# Patient Record
Sex: Female | Born: 1965
Health system: Southern US, Community
[De-identification: ages and names within clinical notes are randomized; demographics above are authoritative.]

## PROBLEM LIST (undated history)

## (undated) DIAGNOSIS — I10 Essential (primary) hypertension: Secondary | ICD-10-CM

## (undated) DIAGNOSIS — R7989 Other specified abnormal findings of blood chemistry: Secondary | ICD-10-CM

## (undated) HISTORY — DX: Essential (primary) hypertension: I10

## (undated) HISTORY — DX: Other specified abnormal findings of blood chemistry: R79.89

## (undated) HISTORY — PX: DILATION AND CURETTAGE OF UTERUS: SHX78

## (undated) HISTORY — PX: OTHER SURGICAL HISTORY: SHX169

---

## 2014-09-28 DIAGNOSIS — R7989 Other specified abnormal findings of blood chemistry: Secondary | ICD-10-CM

## 2014-09-28 HISTORY — DX: Other specified abnormal findings of blood chemistry: R79.89

## 2015-06-07 ENCOUNTER — Ambulatory Visit (INDEPENDENT_AMBULATORY_CARE_PROVIDER_SITE_OTHER): Payer: 59 | Admitting: Obstetrics and Gynecology

## 2015-06-07 ENCOUNTER — Other Ambulatory Visit: Payer: Self-pay | Admitting: Obstetrics and Gynecology

## 2015-06-07 ENCOUNTER — Encounter: Payer: Self-pay | Admitting: Obstetrics and Gynecology

## 2015-06-07 VITALS — BP 120/82 | HR 80 | Resp 16 | Ht 63.0 in | Wt 142.6 lb

## 2015-06-07 DIAGNOSIS — Z01419 Encounter for gynecological examination (general) (routine) without abnormal findings: Secondary | ICD-10-CM

## 2015-06-07 DIAGNOSIS — Z1231 Encounter for screening mammogram for malignant neoplasm of breast: Secondary | ICD-10-CM | POA: Diagnosis not present

## 2015-06-07 DIAGNOSIS — N926 Irregular menstruation, unspecified: Secondary | ICD-10-CM

## 2015-06-07 LAB — TSH: TSH: 6.726 u[IU]/mL — AB (ref 0.350–4.500)

## 2015-06-07 NOTE — Progress Notes (Signed)
Patient scheduled while in office for screening mammogram on 06/13/2015 at 12:40 pm with the [facility].

## 2015-06-07 NOTE — Patient Instructions (Signed)

## 2015-06-07 NOTE — Progress Notes (Signed)
Patient ID: Laurie King, female   DOB: Sep 07, 1966, 49 y.o.   MRN: [id number] GYNECOLOGY  VISIT   HPI: 49 y.o.   Married  Asian  female   G1P1 with Patient's last menstrual period was 05/11/2015 (exact date).   Wants an annual exam.  Insurance company recommending a routine annual exam for the patient and for her to establish care.  Husband and interpretor in room for entire history and examination.   Menses can be every 30 days or further apart.  Menses can last up to 10 days or can be short.  Generally, flow is light.  Some mid cycle bleeding on occasion. Some normal low back pain with cycles.  No hot flashes.   Uncertain if she had a dx of fibroid 51 years ago.   From [place].  Married for 3 years.  Met husband online and moved to the [place].  PCP - Dr. Christella Noa [facility]  GYNECOLOGIC HISTORY: Patient's last menstrual period was 05/11/2015 (exact date). Contraception:Condoms Menopausal hormone therapy: none Last mammogram: NEVER  Last pap smear: Unsure if she has ever had a pap smear Colonoscopy - none.   OB History    Gravida Para Term Preterm AB TAB SAB Ectopic Multiple Living   '1 1        1         '$ There are no active problems to display for this patient.   History reviewed. No pertinent past medical history.  Past Surgical History  Procedure Laterality Date  . Left arm surgery      No current outpatient prescriptions on file.   No current facility-administered medications for this visit.     ALLERGIES: Review of patient's allergies indicates no known allergies.  Family History  Problem Relation Age of Onset  . Aneurysm Father     Social History   Social History  . Marital Status: Married    Spouse Name: N/A  . Number of Children: N/A  . Years of Education: N/A   Occupational History  . Not on file.   Social History Main Topics  . Smoking status: Never Smoker   . Smokeless tobacco: Not on file  . Alcohol Use: No  . Drug Use:  No  . Sexual Activity:    Partners: Male    Birth Control/ Protection: Condom   Other Topics Concern  . Not on file   Social History Narrative  . No narrative on file    ROS:  Pertinent items are noted in HPI.  PHYSICAL EXAMINATION:    BP 120/82 mmHg  Pulse 80  Resp 16  Ht $R'5\' 3"'Wy$  (1.6 m)  Wt 142 lb 9.6 oz (64.683 kg)  BMI 25.27 kg/m2  LMP 05/11/2015 (Exact Date)    General appearance: alert, cooperative and appears stated age Head: Normocephalic, without obvious abnormality, atraumatic Neck: no adenopathy, supple, symmetrical, trachea midline and thyroid normal to inspection and palpation Lungs: clear to auscultation bilaterally Breasts: normal appearance, no masses or tenderness, Inspection negative, No nipple retraction or dimpling, No nipple discharge or bleeding, No axillary or supraclavicular adenopathy Heart: regular rate and rhythm Abdomen: soft, non-tender; bowel sounds normal; no masses,  no organomegaly Extremities: extremities normal, atraumatic, no cyanosis or edema Skin: Skin color, texture, turgor normal. No rashes or lesions Lymph nodes: Cervical, supraclavicular, and axillary nodes normal. No abnormal inguinal nodes palpated Neurologic: Grossly normal  Pelvic: External genitalia:  no lesions  Urethra:  normal appearing urethra with no masses, tenderness or lesions              Bartholins and Skenes: normal                 Vagina: normal appearing vagina with normal color and discharge, no lesions              Cervix: no lesions and Mild stenosis - bled with endocervical brush.              Pap taken: Yes.   Bimanual Exam:  Uterus:  normal size, contour, position, consistency, mobility, non-tender              Adnexa: normal adnexa and no mass, fullness, tenderness              Rectovaginal: Yes.  .  Confirms.              Anus:  normal sphincter tone, no lesions  Chaperone was present for exam.  ASSESSMENT  Perimenopausal female.  Some  menstrual alterations.  Normal gynecologic exam.   PLAN  Will do TSH.  Return for pelvic ultrasound if cycle become closer together or last longer.  Pap and HR HPV testing.  Will schedule mammogram for the patient today.  Breast self exam encouraged. Colonoscopy recommended.  Will arrange through PCP.  Routine labs through PCP.  Reading material given to patient and husband for them to read together about perimenopause/menopause and the changes to expect.  Return annually and prn.    An After Visit Summary was printed and given to the patient.

## 2015-06-10 LAB — T3, FREE: T3 FREE: 2.8 pg/mL (ref 2.3–4.2)

## 2015-06-10 LAB — T4, FREE: FREE T4: 0.95 ng/dL (ref 0.80–1.80)

## 2015-06-11 ENCOUNTER — Encounter: Payer: Self-pay | Admitting: Obstetrics and Gynecology

## 2015-06-11 ENCOUNTER — Other Ambulatory Visit: Payer: Self-pay | Admitting: Obstetrics and Gynecology

## 2015-06-11 DIAGNOSIS — R7989 Other specified abnormal findings of blood chemistry: Secondary | ICD-10-CM

## 2015-06-11 LAB — IPS PAP TEST WITH HPV

## 2015-06-13 ENCOUNTER — Ambulatory Visit
Admission: RE | Admit: 2015-06-13 | Discharge: 2015-06-13 | Disposition: A | Discharge status: Home / Self Care | Payer: 59 | Source: Ambulatory Visit | Attending: Obstetrics and Gynecology | Admitting: Obstetrics and Gynecology

## 2015-06-13 ENCOUNTER — Telehealth: Payer: Self-pay | Admitting: Emergency Medicine

## 2015-06-13 DIAGNOSIS — Z1231 Encounter for screening mammogram for malignant neoplasm of breast: Secondary | ICD-10-CM

## 2015-06-13 NOTE — Telephone Encounter (Signed)
-----   Message from Nunzio Cobbs, MD sent at 06/11/2015  8:24 PM EDT ----- Please inform patient of her results and schedule a follow up appointment with me for 3 months from now. You will need the language line for this call.   Her TSH, thyroid stimulating hormone is elevated, but her T3 and T4 (actual thyroid hormone levels) are normal.  She will need to have this retested in 3 months to see if it she is developing low thyroid function. Yes, low function.   No other testing is indicated at this time.  I did not check any other hormones because she is still menstruating, so it did not make sense at this time to do an [facility] or estradiol.  The levels would indicate she is not postmenopausal.    When the patient returns, I would also like to review her menstrual cycles again.  I would like for her to keep a calendar of her bleeding.  Her pap was normal and HR HPV negative. (Please enter annual exam recall - 02.)  She will need an interpretor with her at her appointment.   Cc- Marisa Sprinkles

## 2015-06-13 NOTE — Telephone Encounter (Signed)
Call to patient. She requested that I speak with her husband, Quillian Quince. He is on designated party release form. She declined use of [place]arin [place] interpretor.   Quillian Quince is given message from Dr. Quincy Simmonds. He verbalized understanding of results and need for follow up. Scheduled follow up appointment with Dr. Quincy Simmonds for 08/30/15 at 1030. She will keep a bleeding calendar.   Advised to call back with any questions or concerns. Follow up as scheduled or before as needed and agreeable.  Routing to provider for final review. Patient agreeable to disposition. Will close encounter.

## 2015-06-14 ENCOUNTER — Other Ambulatory Visit: Payer: Self-pay | Admitting: Obstetrics and Gynecology

## 2015-06-14 DIAGNOSIS — R928 Other abnormal and inconclusive findings on diagnostic imaging of breast: Secondary | ICD-10-CM

## 2015-06-21 ENCOUNTER — Ambulatory Visit
Admission: RE | Admit: 2015-06-21 | Discharge: 2015-06-21 | Disposition: A | Discharge status: Home / Self Care | Payer: 59 | Source: Ambulatory Visit | Attending: Obstetrics and Gynecology | Admitting: Obstetrics and Gynecology

## 2015-06-21 DIAGNOSIS — R928 Other abnormal and inconclusive findings on diagnostic imaging of breast: Secondary | ICD-10-CM

## 2015-08-30 ENCOUNTER — Encounter: Payer: Self-pay | Admitting: Obstetrics and Gynecology

## 2015-08-30 ENCOUNTER — Ambulatory Visit (INDEPENDENT_AMBULATORY_CARE_PROVIDER_SITE_OTHER): Payer: 59 | Admitting: Obstetrics and Gynecology

## 2015-08-30 VITALS — BP 140/84 | HR 76 | Resp 16 | Ht 63.0 in | Wt 136.6 lb

## 2015-08-30 DIAGNOSIS — N951 Menopausal and female climacteric states: Secondary | ICD-10-CM | POA: Diagnosis not present

## 2015-08-30 DIAGNOSIS — R5383 Other fatigue: Secondary | ICD-10-CM | POA: Diagnosis not present

## 2015-08-30 DIAGNOSIS — R946 Abnormal results of thyroid function studies: Secondary | ICD-10-CM | POA: Diagnosis not present

## 2015-08-30 DIAGNOSIS — R7989 Other specified abnormal findings of blood chemistry: Secondary | ICD-10-CM

## 2015-08-30 LAB — CBC
HCT: 42.4 % (ref 36.0–46.0)
HEMOGLOBIN: 13.9 g/dL (ref 12.0–15.0)
MCH: 29.7 pg (ref 26.0–34.0)
MCHC: 32.8 g/dL (ref 30.0–36.0)
MCV: 90.6 fL (ref 78.0–100.0)
MPV: 9.9 fL (ref 8.6–12.4)
PLATELETS: 274 10*3/uL (ref 150–400)
RBC: 4.68 MIL/uL (ref 3.87–5.11)
RDW: 14.4 % (ref 11.5–15.5)
WBC: 6 10*3/uL (ref 4.0–10.5)

## 2015-08-30 LAB — THYROID PANEL WITH TSH
Free Thyroxine Index: 2.1 (ref 1.4–3.8)
T3 Uptake: 29 % (ref 22–35)
T4 TOTAL: 7.1 ug/dL (ref 4.5–12.0)
TSH: 6.829 u[IU]/mL — ABNORMAL HIGH (ref 0.350–4.500)

## 2015-08-30 NOTE — Patient Instructions (Signed)
Menopause and Herbal Products WHAT IS MENOPAUSE? Menopause is the normal time of life when menstrual periods decrease in frequency and eventually stop completely. This process can take several years for some women. Menopause is complete when you have had an absence of menstruation for a full year since your last menstrual period. It usually occurs between the ages of 48 and 55. It is not common for menopause to begin before the age of 40. During menopause, your body stops producing the female hormones estrogen and progesterone. Common symptoms associated with this loss of hormones (vasomotor symptoms) are:  Hot flashes.  Hot flushes.  Night sweats. Other common symptoms and complications of menopause include:  Decrease in sex drive.  Vaginal dryness and thinning of the walls of the vagina. This can make sex painful.  Dryness of the skin and development of wrinkles.  Headaches.  Tiredness.  Irritability.  Memory problems.  Weight gain.  Bladder infections.  Hair growth on the face and chest.  Inability to reproduce offspring (infertility).  Loss of density in the bones (osteoporosis) increasing your risk for breaks (fractures).  Depression.  Hardening and narrowing of the arteries (atherosclerosis). This increases your risk of heart attack and stroke. WHAT TREATMENT OPTIONS ARE AVAILABLE? There are many treatment choices for menopause symptoms. The most common treatment is hormone replacement therapy. Many alternative therapies for menopause are emerging, including the use of herbal products. These supplements can be found in the form of herbs, teas, oils, tinctures, and pills. Common herbal supplements for menopause are made from plants that contain phytoestrogens. Phytoestrogens are compounds that occur naturally in plants and plant products. They act like estrogen in the body. Foods and herbs that contain phytoestrogens include:  Soy.  Flax seeds.  Red  clover.  Ginseng. WHAT MENOPAUSE SYMPTOMS MAY BE HELPED IF I USE HERBAL PRODUCTS?  Vasomotor symptoms. These may be helped by:  Soy. Some studies show that soy may have a moderate benefit for hot flashes.  Black cohosh. There is limited evidence indicating this may be beneficial for hot flashes.  Symptoms that are related to heart and blood vessel disease. These may be helped by soy. Studies have shown that soy can help to lower cholesterol.  Depression. This may be helped by:  St. John's wort. There is limited evidence that shows this may help mild to moderate depression.  Black cohosh. There is evidence that this may help depression and mood swings.  Osteoporosis. Soy may help to decrease bone loss that is associated with menopause and may prevent osteoporosis. Limited evidence indicates that red clover may offer some bone loss protection as well. Other herbal products that are commonly used during menopause lack enough evidence to support their use as a replacement for conventional menopause therapies. These products include evening primrose, ginseng, and red clover. WHAT ARE THE CASES WHEN HERBAL PRODUCTS SHOULD NOT BE USED DURING MENOPAUSE? Do not use herbal products during menopause without your health care provider's approval if:  You are taking medicine.  You have a preexisting liver condition. ARE THERE ANY RISKS IN MY TAKING HERBAL PRODUCTS DURING MENOPAUSE? If you choose to use herbal products to help with symptoms of menopause, keep in mind that:  Different supplements have different and unmeasured amounts of herbal ingredients.  Herbal products are not regulated the same way that medicines are.  Concentrations of herbs may vary depending on the way they are prepared. For example, the concentration may be different in a pill, tea, oil, and tincture.    Little is known about the risks of using herbal products, particularly the risks of long-term use.  Some herbal  supplements can be harmful when combined with certain medicines. Most commonly reported side effects of herbal products are mild. However, if used improperly, many herbal supplements can cause serious problems. Talk to your health care provider before starting any herbal product. If problems develop, stop taking the supplement and let your health care provider know.   This information is not intended to replace advice given to you by your health care provider. Make sure you discuss any questions you have with your health care provider.   Document Released: 03/02/2008 Document Revised: 10/05/2014 Document Reviewed: 02/27/2014 Elsevier Interactive Patient Education 2016 Elsevier Inc.  

## 2015-08-30 NOTE — Addendum Note (Signed)
Addended by: Charmayne Sheer on: 08/30/2015 01:30 PM   Modules accepted: Orders

## 2015-08-30 NOTE — Progress Notes (Signed)
Patient ID: Laurie King, female   DOB: 1965-10-05, 49 y.o.   MRN: [id number] GYNECOLOGY  VISIT   HPI: 49 y.o.   Married  Asian  female   G1P1 with Patient's last menstrual period was 06/26/2015 (exact date).   here for 3 month follow up. [place] interpretor present for the entire visit along with the patient's husband.   Having thyroid testing rechecked.  TSH 6.726 and normal T3 and T4 on 06/07/15. Has some fatigue.   Patient states she has had only one menstrual cycle since last office visit which did last for 10 days.  LMP date 06/26/15. Some increased heat sensation in afternoons.  Sometimes night sweats.   GYNECOLOGIC HISTORY: Patient's last menstrual period was 06/26/2015 (exact date). Contraception: Condoms Menopausal hormone therapy: none Last mammogram: 06-21-15 3D density Cat.C/Neg/BiRads1:[facility] Last pap smear: 06-07-15 Neg:Neg HR HPV        OB History    Gravida Para Term Preterm AB TAB SAB Ectopic Multiple Living   1 1        1          There are no active problems to display for this patient.   Past Medical History  Diagnosis Date  . Elevated TSH September 2016    Past Surgical History  Procedure Laterality Date  . Left arm surgery      No current outpatient prescriptions on file.   No current facility-administered medications for this visit.     ALLERGIES: Review of patient's allergies indicates no known allergies.  Family History  Problem Relation Age of Onset  . Aneurysm Father     Social History   Social History  . Marital Status: Married    Spouse Name: N/A  . Number of Children: N/A  . Years of Education: N/A   Occupational History  . Not on file.   Social History Main Topics  . Smoking status: Never Smoker   . Smokeless tobacco: Not on file  . Alcohol Use: No  . Drug Use: No  . Sexual Activity:    Partners: Male    Birth Control/ Protection: Condom   Other Topics Concern  . Not on file   Social History Narrative     ROS:  Pertinent items are noted in HPI.  PHYSICAL EXAMINATION:    BP 140/84 mmHg  Pulse 76  Resp 16  Ht 5\' 3"  (1.6 m)  Wt 136 lb 9.6 oz (61.961 kg)  BMI 24.20 kg/m2  LMP 06/26/2015 (Exact Date)    General appearance: alert, cooperative and appears stated age  ASSESSMENT  Elevated TSH.  Normal thyroid hormone levels.  Fatigue.  Menopausal symptoms.  Skipped menses.    PLAN  Counseled regarding thyroid function testing and signs and symptoms of hypothryroidism.  Will check TSH, free T3 and free T4, FSH, estradiol, CBC.  Discussed herbal options for menopausal symptoms.  May do a course of Provera depending on the results of the blood work.  Would need to do a UPT first if she will take this.  Purpose of side effects of the medication discussed.  Follow up prn.   An After Visit Summary was printed and given to the patient.  __15____ minutes face to face time of which over 50% was spent in counseling.

## 2015-08-31 LAB — ESTRADIOL: Estradiol: 17.8 pg/mL

## 2015-08-31 LAB — FOLLICLE STIMULATING HORMONE: FSH: 143 m[IU]/mL — AB

## 2015-09-01 ENCOUNTER — Encounter: Payer: Self-pay | Admitting: Obstetrics and Gynecology

## 2015-09-03 ENCOUNTER — Telehealth: Payer: Self-pay | Admitting: Emergency Medicine

## 2015-09-03 NOTE — Telephone Encounter (Signed)
-----   Message from Laurie Cobbs, MD sent at 09/01/2015  7:00 PM EST ----- Please report results to patient.  Check DPI form. Patient usually comes in with her husband and an interpretor.   Labs are showing [name] and estradiol consistent with menopause.  I would still like her to keep a menstrual calendar.   If she goes 12 consecutive months with no menses, she is truly postmenopausal.  She does not need to take the Provera that I suggested she may need.  With respect to her thyroid testing, it looks like she is developing hypothyroidism.   Her TSH is elevated, slightly more than the 2 months ago, but the actual thyroid level is still normal.  I would like patient to be followed by here PCP, Dr. Elesa Hacker, [place] at [facility], for this.   Cc- Marisa Sprinkles

## 2015-09-03 NOTE — Telephone Encounter (Signed)
Call to patient mobile. She requests I call husband and give results. Husband is listed on designated party release form.  Call to husband and message from Dr. Quincy Simmonds given.  He verbalized understanding and will give message to patient and verbalized understanding of need to follow up with PCP.  Lab printed and faxed to Dr. Olen Pel at [facility] [phone].   Routing to provider for final review. Patient agreeable to disposition. Will close encounter.

## 2016-10-12 DIAGNOSIS — I10 Essential (primary) hypertension: Secondary | ICD-10-CM | POA: Diagnosis not present

## 2016-11-11 DIAGNOSIS — I10 Essential (primary) hypertension: Secondary | ICD-10-CM | POA: Diagnosis not present

## 2017-02-05 DIAGNOSIS — I10 Essential (primary) hypertension: Secondary | ICD-10-CM | POA: Diagnosis not present

## 2017-09-08 DIAGNOSIS — Z1211 Encounter for screening for malignant neoplasm of colon: Secondary | ICD-10-CM | POA: Diagnosis not present

## 2017-09-08 DIAGNOSIS — D126 Benign neoplasm of colon, unspecified: Secondary | ICD-10-CM | POA: Diagnosis not present

## 2017-09-24 DIAGNOSIS — I10 Essential (primary) hypertension: Secondary | ICD-10-CM | POA: Diagnosis not present

## 2018-05-02 ENCOUNTER — Ambulatory Visit: Payer: Self-pay | Admitting: Obstetrics and Gynecology

## 2018-05-16 ENCOUNTER — Other Ambulatory Visit: Payer: Self-pay

## 2018-05-16 ENCOUNTER — Encounter: Payer: Self-pay | Admitting: Obstetrics and Gynecology

## 2018-05-16 ENCOUNTER — Other Ambulatory Visit (HOSPITAL_COMMUNITY)
Admission: RE | Admit: 2018-05-16 | Discharge: 2018-05-16 | Disposition: A | Discharge status: Home / Self Care | Payer: Commercial Managed Care - PPO | Source: Ambulatory Visit | Attending: Obstetrics and Gynecology | Admitting: Obstetrics and Gynecology

## 2018-05-16 ENCOUNTER — Ambulatory Visit: Payer: Commercial Managed Care - PPO | Admitting: Obstetrics and Gynecology

## 2018-05-16 VITALS — BP 110/60 | HR 76 | Resp 16 | Ht 62.75 in | Wt 139.0 lb

## 2018-05-16 DIAGNOSIS — N951 Menopausal and female climacteric states: Secondary | ICD-10-CM | POA: Diagnosis not present

## 2018-05-16 DIAGNOSIS — Z1151 Encounter for screening for human papillomavirus (HPV): Secondary | ICD-10-CM | POA: Insufficient documentation

## 2018-05-16 DIAGNOSIS — R7989 Other specified abnormal findings of blood chemistry: Secondary | ICD-10-CM | POA: Diagnosis not present

## 2018-05-16 DIAGNOSIS — Z01419 Encounter for gynecological examination (general) (routine) without abnormal findings: Secondary | ICD-10-CM | POA: Insufficient documentation

## 2018-05-16 NOTE — Progress Notes (Signed)
52 y.o. G1P1 Married [place] female here for annual exam.    LMP 12/27/17.  Prior LMP was March 2019.  Prior to that her periods was over one year ago.  Sometimes she has hot flashes.   Itchy rash on her chest. No pain.   Had an elevated TSH in 2016.  States she had follow up and the TSH is still elevated.  Not on thyroid medication.   Does labs at work.   PCP: Dr. Christella Noa    Patient's last menstrual period was 12/27/2017 (within months).           Sexually active: Yes.    The current method of family planning is condoms most of the time.    Exercising: Yes.    walking Smoker:  no  Health Maintenance: Pap:  06/07/15 Neg:Neg HR HPV History of abnormal Pap:  no MMG:  06/21/15 BIRADS 1 negative/density c Colonoscopy:  Early 2019 -- polyps removed -- [facility] BMD:   n/a  Result  n/a TDaP:  About 6 years ago per patient Gardasil:   n/a HIV: never Hep C: never Screening Labs: PCP   reports that she has never smoked. She has never used smokeless tobacco. She reports that she does not drink alcohol or use drugs.  Past Medical History:  Diagnosis Date  . Elevated TSH 2016   normal T4  . Hypertension     Past Surgical History:  Procedure Laterality Date  . left arm surgery      Current Outpatient Medications  Medication Sig Dispense Refill  . hydrochlorothiazide (MICROZIDE) 12.5 MG capsule TAKE 1 CAPSULE BY MOUTH EVERY DAY IN THE MORNING  5  . losartan (COZAAR) 100 MG tablet Take 100 mg by mouth daily.  5   No current facility-administered medications for this visit.     Family History  Problem Relation Age of Onset  . Aneurysm Father     Review of Systems  All other systems reviewed and are negative.   Exam:   BP 110/60 (BP Location: Right Arm, Patient Position: Sitting, Cuff Size: Normal)   Pulse 76   Resp 16   Ht 5' 2.75" (1.594 m)   Wt 139 lb (63 kg)   LMP 12/27/2017 (Within Months)   BMI 24.82 kg/m     General appearance: alert, cooperative and  appears stated age Head: Normocephalic, without obvious abnormality, atraumatic Neck: no adenopathy, supple, symmetrical, trachea midline and thyroid normal to inspection and palpation Lungs: clear to auscultation bilaterally Breasts: right - normal appearance, no masses or tenderness, No nipple retraction or dimpling, No nipple discharge or bleeding, No axillary or supraclavicular adenopathy Left with vesicular healing rash on anterior and posterior chest.  No masses or tenderness, No nipple retraction or dimpling, No nipple discharge or bleeding, No axillary or supraclavicular adenopathy Heart: regular rate and rhythm Abdomen: soft, non-tender; no masses, no organomegaly Extremities: extremities normal, atraumatic, no cyanosis or edema Skin: Skin color, texture, turgor normal. No rashes or lesions Lymph nodes: Cervical, supraclavicular, and axillary nodes normal. No abnormal inguinal nodes palpated Neurologic: Grossly normal  Pelvic: External genitalia:  no lesions              Urethra:  normal appearing urethra with no masses, tenderness or lesions              Bartholins and Skenes: normal                 Vagina: normal appearing vagina with normal  color and discharge, no lesions              Cervix: no lesions.  Stenosis.  Bled with cytobrush use.  Small 3 - 4 mm fibroid versus nabothian cyst.               Pap taken: Yes.   Bimanual Exam:  Uterus:  normal size, contour, position, consistency, mobility, non-tender              Adnexa: no mass, fullness, tenderness              Rectal exam: Yes.  .  Confirms.              Anus:  normal sphincter tone, no lesions.  Small hemorrhoid noted.   Chaperone was present for exam.  Assessment:   Well woman visit with normal exam. Elevated TSH.  Menopausal symptoms.   Bleeding this spring.   Shingles?  Plan: Mammogram screening. Recommended self breast awareness. Pap and HR HPV as above. Guidelines for Calcium, Vitamin D, regular  exercise program including cardiovascular and weight bearing exercise. TSH, free T4, FSH, E2.  Call for any future bleeding.  Follow up with PCP if rash is uncomfortable or persistent. No antiviral Rx given.  Follow up annually and prn.    After visit summary provided.

## 2018-05-16 NOTE — Patient Instructions (Signed)

## 2018-05-17 LAB — FOLLICLE STIMULATING HORMONE: FSH: 144.5 m[IU]/mL

## 2018-05-17 LAB — CYTOLOGY - PAP
Diagnosis: NEGATIVE
HPV: NOT DETECTED

## 2018-05-17 LAB — T4, FREE: Free T4: 1.11 ng/dL (ref 0.82–1.77)

## 2018-05-17 LAB — ESTRADIOL

## 2018-05-17 LAB — TSH: TSH: 8.07 u[IU]/mL — AB (ref 0.450–4.500)

## 2018-05-20 ENCOUNTER — Other Ambulatory Visit: Payer: Self-pay | Admitting: *Deleted

## 2018-05-20 DIAGNOSIS — N950 Postmenopausal bleeding: Secondary | ICD-10-CM

## 2018-05-23 ENCOUNTER — Telehealth: Payer: Self-pay | Admitting: Obstetrics and Gynecology

## 2018-05-23 NOTE — Telephone Encounter (Signed)
Call placed to patient to review benefits and schedule recommended ultrasound. Patients spouse Linna Hoff answered the phone, he is listed on her most recent [employer]. Theone Murdoch, I was calling to convey benefits and scheduled an appointment. Dan advised patient is at work, and is not always able to receive calls at work, Spouse states he will have patient to call our office for scheduling.

## 2018-05-24 NOTE — Telephone Encounter (Signed)
Patient returning call to [facility] [facility]. I conveyed benefit information to husband and scheduled [place] on 06/02/18 with Dr Quincy Simmonds. Husband agreeable and made aware of cancellation policy.   Will close encounter.

## 2018-06-02 ENCOUNTER — Other Ambulatory Visit: Payer: Commercial Managed Care - PPO

## 2018-06-02 ENCOUNTER — Other Ambulatory Visit: Payer: Commercial Managed Care - PPO | Admitting: Obstetrics and Gynecology

## 2018-06-09 ENCOUNTER — Other Ambulatory Visit: Payer: Self-pay

## 2018-06-09 ENCOUNTER — Ambulatory Visit: Payer: Commercial Managed Care - PPO | Admitting: Obstetrics and Gynecology

## 2018-06-09 ENCOUNTER — Encounter: Payer: Self-pay | Admitting: Obstetrics and Gynecology

## 2018-06-09 ENCOUNTER — Ambulatory Visit (INDEPENDENT_AMBULATORY_CARE_PROVIDER_SITE_OTHER): Payer: Commercial Managed Care - PPO

## 2018-06-09 VITALS — BP 116/76 | HR 78 | Temp 98.1°F | Resp 12 | Ht 62.75 in | Wt 139.4 lb

## 2018-06-09 DIAGNOSIS — N950 Postmenopausal bleeding: Secondary | ICD-10-CM

## 2018-06-09 DIAGNOSIS — N9489 Other specified conditions associated with female genital organs and menstrual cycle: Secondary | ICD-10-CM

## 2018-06-09 DIAGNOSIS — D219 Benign neoplasm of connective and other soft tissue, unspecified: Secondary | ICD-10-CM

## 2018-06-09 DIAGNOSIS — N858 Other specified noninflammatory disorders of uterus: Secondary | ICD-10-CM | POA: Diagnosis not present

## 2018-06-09 NOTE — Progress Notes (Signed)
GYNECOLOGY  VISIT   HPI: 52 y.o.   Married  Asian  female   G1P1 with Patient's last menstrual period was 12/27/2017 (within months).   here for   Pelvic ultrasound Patient with hx of postmenopausal bleeding.  Had vaginal bleeding in March and April 2019.  Prior menses was over one year ago.  Recent FSH and E2 confirm menopause.  Interpretor present for the discussion portion of the visit today.  Husband is also present.   GYNECOLOGIC HISTORY: Patient's last menstrual period was 12/27/2017 (within months). Contraception:  Post menopausal  Menopausal hormone therapy:  none Last mammogram:  06-21-15 density C/BIRADS 1 negative  Last pap smear:   05-16-18 negative, HR HPV negative         OB History    Gravida  1   Para  1   Term      Preterm      AB      Living  1     SAB      TAB      Ectopic      Multiple      Live Births                 Patient Active Problem List   Diagnosis Date Noted  . Elevated TSH 05/16/2018    Past Medical History:  Diagnosis Date  . Elevated TSH 2016   normal T4  . Hypertension     Past Surgical History:  Procedure Laterality Date  . left arm surgery      Current Outpatient Medications  Medication Sig Dispense Refill  . hydrochlorothiazide (MICROZIDE) 12.5 MG capsule TAKE 1 CAPSULE BY MOUTH EVERY DAY IN THE MORNING  5  . losartan (COZAAR) 100 MG tablet Take 100 mg by mouth daily.  5   No current facility-administered medications for this visit.      ALLERGIES: Patient has no known allergies.  Family History  Problem Relation Age of Onset  . Aneurysm Father     Social History   Socioeconomic History  . Marital status: Married    Spouse name: Not on file  . Number of children: Not on file  . Years of education: Not on file  . Highest education level: Not on file  Occupational History  . Not on file  Social Needs  . Financial resource strain: Not on file  . Food insecurity:    Worry: Not on file     Inability: Not on file  . Transportation needs:    Medical: Not on file    Non-medical: Not on file  Tobacco Use  . Smoking status: Never Smoker  . Smokeless tobacco: Never Used  Substance and Sexual Activity  . Alcohol use: No    Alcohol/week: 0.0 standard drinks  . Drug use: No  . Sexual activity: Yes    Partners: Male    Birth control/protection: Condom  Lifestyle  . Physical activity:    Days per week: Not on file    Minutes per session: Not on file  . Stress: Not on file  Relationships  . Social connections:    Talks on phone: Not on file    Gets together: Not on file    Attends religious service: Not on file    Active member of club or organization: Not on file    Attends meetings of clubs or organizations: Not on file    Relationship status: Not on file  . Intimate partner  violence:    Fear of current or ex partner: Not on file    Emotionally abused: Not on file    Physically abused: Not on file    Forced sexual activity: Not on file  Other Topics Concern  . Not on file  Social History Narrative  . Not on file    Review of Systems  Constitutional: Positive for fever.  HENT: Negative.   Eyes: Negative.   Respiratory: Negative.   Cardiovascular: Negative.   Gastrointestinal: Negative.   Endocrine: Negative.   Genitourinary: Positive for dyspareunia.  Musculoskeletal: Negative.   Skin: Negative.   Allergic/Immunologic: Negative.   Neurological: Negative.   Hematological: Negative.   Psychiatric/Behavioral: Negative.     PHYSICAL EXAMINATION:    BP 116/76 (BP Location: Right Arm, Patient Position: Sitting, Cuff Size: Normal)   Pulse 78   Temp 98.1 F (36.7 C) (Oral)   Resp 12   Ht 5' 2.75" (1.594 m)   Wt 139 lb 6.4 oz (63.2 kg)   LMP 12/27/2017 (Within Months)   BMI 24.89 kg/m     General appearance: alert, cooperative and appears stated age    Pelvic: External genitalia:  no lesions              Urethra:  normal appearing urethra with no  masses, tenderness or lesions              Bartholins and Skenes: normal                 Vagina: normal appearing vagina with normal color and discharge, no lesions              Cervix: no lesions                Bimanual Exam:  Uterus:  normal size, contour, position, consistency, mobility, non-tender              Adnexa: no mass, fullness, tenderness             EMB: Consent for procedure. Sterile prep with   Paracervical block with 1% lidocaine 6 cc, lot number     [id number]                 , expiration  1/23. Tenaculum to anterior cervical lip. Pipelle passed to   7       cm twice.   Tissue to pathology.  Minimal EBL. No complications.   Chaperone was present for exam.  ASSESSMENT  Postmenopausal bleeding.  Endometrial thickening/mass. Fibroids.  PLAN  We discussed the endometrial findings and the fibroids and possibilities for the thickening - polyp, hyperplasia, fibroid, malignancy.  FU EMB.  Instructions and precautions given.  Expect to proceed with hysteroscopy with dilation and curettage.  ACOG handouts given.  Will need to return for preop visit.    An After Visit Summary was printed and given to the patient.  __15____ minutes face to face time of which over 50% was spent in counseling.

## 2018-06-09 NOTE — Patient Instructions (Signed)

## 2018-06-11 DIAGNOSIS — N9489 Other specified conditions associated with female genital organs and menstrual cycle: Secondary | ICD-10-CM | POA: Insufficient documentation

## 2018-06-14 ENCOUNTER — Telehealth: Payer: Self-pay | Admitting: Emergency Medicine

## 2018-06-14 NOTE — Telephone Encounter (Signed)
-----   Message from Nunzio Cobbs, MD sent at 06/12/2018  5:00 PM EDT ----- Please report results of EMB showing benign tissue.  No polyp, hyperplasia, or malignancy were seen.  She has a mass on pelvic ultrasound, and the biopsy did not capture the tissue.  I am recommending proceeding forward with a hysteroscopic resection of endometrial mass and dilation and curettage.  She and her husband are expecting this.  This will need to go to precert and she will need a preop visit with an interpretor present.   Cc- [name], Lerry Liner

## 2018-06-14 NOTE — Telephone Encounter (Signed)
Spoke with husband of patient, okay per designated party release form, husbands phone is listed at home number.  Advised of negative pathology from Endometrial biopsy and recommendations from Dr. Quincy Simmonds.   Husband, Quillian Quince states they are ready to move forward with planning procedure.   Advised next step is to speak with Deloris Ping and then [facility] for scheduling. He agrees to plan and will discuss results with his wife today.   cc [name], RN and Lerry Liner.  Will close.

## 2018-06-15 ENCOUNTER — Encounter: Payer: Self-pay | Admitting: Obstetrics and Gynecology

## 2018-06-20 ENCOUNTER — Telehealth: Payer: Self-pay | Admitting: Obstetrics and Gynecology

## 2018-06-20 NOTE — Telephone Encounter (Signed)
Call placed to patient to review benefits for recommended surgical procedure. Left voice mail message requesting a return call.   cc: [name], RN

## 2018-06-21 ENCOUNTER — Other Ambulatory Visit: Payer: Self-pay | Admitting: Obstetrics and Gynecology

## 2018-06-21 DIAGNOSIS — Z1231 Encounter for screening mammogram for malignant neoplasm of breast: Secondary | ICD-10-CM

## 2018-06-21 NOTE — Telephone Encounter (Signed)
Spoke with patients spouse, Brionna Romanek, he is listed on her Environmental consultant. Reviewed benefit for recommended procedure. Patient's spouse understood information presented. Mr Laurie King, states they need more information regarding the medical reasons for procedure and why the procedure is needed, before proceeding.  Forwarding to Conservation officer, historic buildings to address additional questions  Routing to [name], RN

## 2018-06-22 NOTE — Telephone Encounter (Signed)
Call to husband, Linna Hoff, on [place].  Linna Hoff states patient feels this is more than necessary for one episode of bleeding. Discussed PMB and ultrasound/endobiopsy results. Stressed that patient needs to know that amount of bleeding is not indicator of potential abnormality and that procedure is needed for full evaluation to rule our abnormal cells including precancerous or cancerous cells.  Offered office visit with Dr Quincy Simmonds to discuss recommendations Husband states this would be beneficial. Needs early am appointment since she works 10-6. States she will not adjust work schedule. Scheduled for 07-06-18 at 9 am. Highly encouraged to consider earlier appointment and if patient agreeable to alternate time.   Routing to Dr Quincy Simmonds.

## 2018-06-22 NOTE — Telephone Encounter (Signed)
Thank you for facilitating this appointment for the patient.  You may close the encounter.

## 2018-06-23 DIAGNOSIS — I10 Essential (primary) hypertension: Secondary | ICD-10-CM | POA: Diagnosis not present

## 2018-06-23 DIAGNOSIS — R7989 Other specified abnormal findings of blood chemistry: Secondary | ICD-10-CM | POA: Diagnosis not present

## 2018-07-06 ENCOUNTER — Ambulatory Visit: Payer: Commercial Managed Care - PPO | Admitting: Obstetrics and Gynecology

## 2018-07-06 ENCOUNTER — Encounter: Payer: Self-pay | Admitting: Obstetrics and Gynecology

## 2018-07-06 VITALS — BP 102/58 | HR 66 | Ht 62.75 in | Wt 137.2 lb

## 2018-07-06 DIAGNOSIS — N9489 Other specified conditions associated with female genital organs and menstrual cycle: Secondary | ICD-10-CM | POA: Diagnosis not present

## 2018-07-06 DIAGNOSIS — N950 Postmenopausal bleeding: Secondary | ICD-10-CM | POA: Diagnosis not present

## 2018-07-06 NOTE — Progress Notes (Signed)
GYNECOLOGY  VISIT   HPI: 52 y.o.   Married  Asian  female   G1P1 with Patient's last menstrual period was 12/27/2017 (within months).   here to discuss surgery.    Patient declined interpretor today.  Husband is present for the entire visit today.   She has postmenopausal bleeding and FSH and E2 confirming menopause.   She has an endometrial mass and fibroids on pelvic US and an EMB showing benign atrophic tissue.   States she had 3 - 4 D & Cs in [place] for interruption of pregnancy.  She also had a dilation and curettage.  GYNECOLOGIC HISTORY: Patient's last menstrual period was 12/27/2017 (within months). Contraception: Postmenopausal Menopausal hormone therapy: none Last mammogram:   06-21-15 density C/BIRADS 1 negative --APPT. 07-07-18 Last pap smear:  05-16-18 negative, HR HPV negative         OB History    Gravida  1   Para  1   Term      Preterm      AB      Living  1     SAB      TAB      Ectopic      Multiple      Live Births                 Patient Active Problem List   Diagnosis Date Noted  . Endometrial mass 06/11/2018  . Elevated TSH 05/16/2018    Past Medical History:  Diagnosis Date  . Elevated TSH 2016   normal T4  . Hypertension     Past Surgical History:  Procedure Laterality Date  . DILATION AND CURETTAGE OF UTERUS     3 - 4 interruption of pregnancy - [place].   Marland Kitchen left arm surgery      Current Outpatient Medications  Medication Sig Dispense Refill  . hydrochlorothiazide (MICROZIDE) 12.5 MG capsule TAKE 1 CAPSULE BY MOUTH EVERY DAY IN THE MORNING  5  . losartan (COZAAR) 100 MG tablet Take 100 mg by mouth daily.  5   No current facility-administered medications for this visit.      ALLERGIES: Patient has no known allergies.  Family History  Problem Relation Age of Onset  . Aneurysm Father     Social History   Socioeconomic History  . Marital status: Married    Spouse name: Not on file  . Number of children: Not  on file  . Years of education: Not on file  . Highest education level: Not on file  Occupational History  . Not on file  Social Needs  . Financial resource strain: Not on file  . Food insecurity:    Worry: Not on file    Inability: Not on file  . Transportation needs:    Medical: Not on file    Non-medical: Not on file  Tobacco Use  . Smoking status: Never Smoker  . Smokeless tobacco: Never Used  Substance and Sexual Activity  . Alcohol use: No    Alcohol/week: 0.0 standard drinks  . Drug use: No  . Sexual activity: Yes    Partners: Male    Birth control/protection: Condom  Lifestyle  . Physical activity:    Days per week: Not on file    Minutes per session: Not on file  . Stress: Not on file  Relationships  . Social connections:    Talks on phone: Not on file    Gets together: Not on file  Attends religious service: Not on file    Active member of club or organization: Not on file    Attends meetings of clubs or organizations: Not on file    Relationship status: Not on file  . Intimate partner violence:    Fear of current or ex partner: Not on file    Emotionally abused: Not on file    Physically abused: Not on file    Forced sexual activity: Not on file  Other Topics Concern  . Not on file  Social History Narrative  . Not on file    Review of Systems  Constitutional: Negative.   HENT: Negative.   Eyes: Negative.   Respiratory: Negative.   Cardiovascular: Negative.   Gastrointestinal: Negative.   Endocrine: Negative.   Genitourinary: Negative.   Musculoskeletal: Negative.   Skin: Negative.   Allergic/Immunologic: Negative.   Neurological: Negative.   Hematological: Negative.   Psychiatric/Behavioral: Negative.     PHYSICAL EXAMINATION:    BP (!) 102/58 (BP Location: Right Arm, Patient Position: Sitting, Cuff Size: Normal)   Pulse 66   Ht 5' 2.75" (1.594 m)   Wt 137 lb 3.2 oz (62.2 kg)   LMP 12/27/2017 (Within Months)   BMI 24.50 kg/m      General appearance: alert, cooperative and appears stated age  ASSESSMENT  Postmenopausal bleeding.  Endometrial mass.  Fibroids.  Hx multiple Dilation and Curettage Procedures.   PLAN  Discussion of postmenopausal bleeding, endometrial mass, and fibroids. Discussion of hysteroscopy with Myosure polypectomy, dilation and curettage.  Risks, benefits, and alternatives reviewed. Risks include but are not limited to bleeding, infection, damage to surrounding organs including uterine perforation requiring hospitalization and laparoscopy, pulmonary edema, reaction to anesthesia, DVT, PE, death, need for further treatment and surgery including hysterectomy. Surgical expectations and recovery discussed.  Patient wishes to proceed.   An After Visit Summary was printed and given to the patient.  __25____ minutes face to face time of which over 50% was spent in counseling.

## 2018-07-07 ENCOUNTER — Ambulatory Visit
Admission: RE | Admit: 2018-07-07 | Discharge: 2018-07-07 | Disposition: A | Discharge status: Home / Self Care | Payer: Commercial Managed Care - PPO | Source: Ambulatory Visit

## 2018-07-07 DIAGNOSIS — Z1231 Encounter for screening mammogram for malignant neoplasm of breast: Secondary | ICD-10-CM | POA: Diagnosis not present

## 2018-07-12 ENCOUNTER — Telehealth: Payer: Self-pay | Admitting: Obstetrics and Gynecology

## 2018-07-12 NOTE — Telephone Encounter (Signed)
See previous phone notes. Spoke with patients spouse Laurie King, (he is listed on the most recent Designated Party Release) regarding benefits for recommended surgery. We had previously reviewed this information, but patient requested another appointment with provider, to discuss medical necessity. Patient has completed that appointment and is ready to proceed with surgery. Spouse advises she will speak with his wife and will call back to confirm.    cc: [name], RN

## 2018-07-12 NOTE — Telephone Encounter (Signed)
Patients spouse called back to confirm that his spouse is ready to proceed with scheduling recommended surgery. Forwarding to Conservation officer, historic buildings for scheduling.  Routing to [name], RN

## 2018-07-15 NOTE — Telephone Encounter (Signed)
Call to patient's husband, Linna Hoff, regarding surgery scheduling.  Patient has vacation cruise planned for 08-08-18 and wants to plan surgery around this trip. Will discuss dates with patient and call back.

## 2018-07-22 NOTE — Telephone Encounter (Signed)
Routing to S. Yeakley, RN.  

## 2018-07-22 NOTE — Telephone Encounter (Signed)
Patient's husband Linna Hoff is calling to schedule surgery.

## 2018-07-25 NOTE — Telephone Encounter (Signed)
I prefer a surgical date of 08/02/18 as patient does not have a diagnostic biopsy to date for her bleeding and endometrial mass.

## 2018-07-25 NOTE — Telephone Encounter (Signed)
Call to patient's husband Linna Hoff. Linna Hoff states their trip is November 14-23. November 19 will not work. Patient requests a Friday. Would really like to decrease time out of work.  Could she wait till Friday, December 13? Advised will need to review provider for continued delay of surgery and availability of that date.

## 2018-07-26 NOTE — Telephone Encounter (Signed)
Dan calls back. States patient declines to proceed with surgery prior to trip. Again stressed to be sure she understands risk of more serious diagnosis and more aggressive treatment required. He states he and patient are aware and accept.  Agreeable to proceed on 08-30-18. Surgery scheduled and surgery instructions reviewed with husband. Husband states patient is able to read [place] instructions and will be able to review printed instructions. Will mail printed copy.   Routing to Dr Quincy Simmonds for final review. Encounter closed.

## 2018-07-26 NOTE — Telephone Encounter (Signed)
Call from [place]. Advised of recommendation to proceed on November 5, prior to trip. Discussed this procedure is to rule out abnormal cells, including precancerous or even cancerous cells.  If declines to proceed on 08-02-18, recommend proceed with first available date upon return.  Linna Hoff will discuss with patient and call back.

## 2018-07-26 NOTE — Telephone Encounter (Signed)
Call to patient's husband, Linna Hoff,. Left message to call back to review surgical date recommendations.

## 2018-08-22 NOTE — Progress Notes (Signed)
GYNECOLOGY  VISIT   HPI: 52 y.o.   Married  Asian  female   G3P1 with Patient's last menstrual period was 12/27/2017 (approximate).   here for pre op visit for Dilatation and Curettage/Hysteroscopy with Myosure scheduled on 08/30/18.  Husband and interpretor present for the visit today.  No further postmenopausal bleeding.   Has endometrial thickness of 14.61 mm  by ultrasound 06/09/18.  Also has 3 small fibroids.  Ovaries are normal.  EMB showed benign atrophic tissue.   Hx  3 - 4 prior dilation and curettage procedures in [place] for [place].   GYNECOLOGIC HISTORY: Patient's last menstrual period was 12/27/2017 (approximate). Contraception:  Postmenopausal Menopausal hormone therapy:  none Last mammogram: 07-07-18 3D Neg/density c/BiRads1 Last pap smear: 05-16-18 negative, HR HPV negative        OB History    Gravida  3   Para  1   Term      Preterm      AB  2   Living  1     SAB      TAB  2   Ectopic      Multiple      Live Births                 Patient Active Problem List   Diagnosis Date Noted  . Postmenopausal bleeding 07/06/2018  . Endometrial mass 06/11/2018  . Elevated TSH 05/16/2018    Past Medical History:  Diagnosis Date  . Elevated TSH 2016   normal T4  . Hypertension     Past Surgical History:  Procedure Laterality Date  . DILATION AND CURETTAGE OF UTERUS     3 - 4 interruption of pregnancy - [place].   Marland Kitchen left arm surgery      Current Outpatient Medications  Medication Sig Dispense Refill  . Calcium Carbonate-Vit D-Min (CALCIUM 1200 PO) Take by mouth daily.    . hydrochlorothiazide (MICROZIDE) 12.5 MG capsule Take 12.5 mg by mouth daily.   5  . losartan (COZAAR) 100 MG tablet Take 100 mg by mouth daily.  5  . Multiple Vitamin (MULTIVITAMIN) tablet Take 1 tablet by mouth daily.    . Omega-3 Fatty Acids (FISH OIL) 1000 MG CAPS Take by mouth daily.     No current facility-administered medications for this visit.      ALLERGIES:  Patient has no known allergies.  Family History  Problem Relation Age of Onset  . Aneurysm Father     Social History   Socioeconomic History  . Marital status: Married    Spouse name: Not on file  . Number of children: Not on file  . Years of education: Not on file  . Highest education level: Not on file  Occupational History  . Not on file  Social Needs  . Financial resource strain: Not on file  . Food insecurity:    Worry: Not on file    Inability: Not on file  . Transportation needs:    Medical: Not on file    Non-medical: Not on file  Tobacco Use  . Smoking status: Never Smoker  . Smokeless tobacco: Never Used  Substance and Sexual Activity  . Alcohol use: No    Alcohol/week: 0.0 standard drinks  . Drug use: No  . Sexual activity: Yes    Partners: Male    Birth control/protection: Condom, Post-menopausal  Lifestyle  . Physical activity:    Days per week: Not on file    Minutes per session:  Not on file  . Stress: Not on file  Relationships  . Social connections:    Talks on phone: Not on file    Gets together: Not on file    Attends religious service: Not on file    Active member of club or organization: Not on file    Attends meetings of clubs or organizations: Not on file    Relationship status: Not on file  . Intimate partner violence:    Fear of current or ex partner: Not on file    Emotionally abused: Not on file    Physically abused: Not on file    Forced sexual activity: Not on file  Other Topics Concern  . Not on file  Social History Narrative  . Not on file    Review of Systems  All other systems reviewed and are negative.   PHYSICAL EXAMINATION:    BP 100/76 (BP Location: Right Arm, Patient Position: Sitting, Cuff Size: Large)   Pulse 84   Resp 16   Ht 5' 2.75" (1.594 m)   Wt 139 lb (63 kg)   LMP 12/27/2017 (Approximate)   BMI 24.82 kg/m     General appearance: alert, cooperative and appears stated age Head: Normocephalic, without  obvious abnormality, atraumatic Neck: no adenopathy, supple, symmetrical, trachea midline and thyroid normal to inspection and palpation Lungs: clear to auscultation bilaterally Heart: regular rate and rhythm Abdomen: soft, non-tender, no masses,  no organomegaly Extremities: extremities normal, atraumatic, no cyanosis or edema Skin: Skin color, texture, turgor normal. No rashes or lesions Lymph nodes: Cervical, supraclavicular, and axillary nodes normal. No abnormal inguinal nodes palpated Neurologic: Grossly normal  Pelvic: External genitalia:  no lesions              Urethra:  normal appearing urethra with no masses, tenderness or lesions              Bartholins and Skenes: normal                 Vagina: normal appearing vagina with normal color and discharge, no lesions              Cervix: no lesions                Bimanual Exam:  Uterus:  normal size, contour, position, consistency, mobility, non-tender              Adnexa: no mass, fullness, tenderness           Chaperone was present for exam.  ASSESSMENT  Postmenopausal bleeding. Endometrial thickness with atrophy noted on biopsy.  Hx multiple dilation and curettage procedures.   PLAN  Discussion of hysteroscopy with Myosure polypectomy, dilation and curettage.  Risks, benefits, and alternatives reviewed. Risks include but are not limited to bleeding, infection, damage to surrounding organs including uterine perforation requiring hospitalization and laparoscopy, pulmonary edema, DVT, PE, death, need for further treatment and surgery including repeat hysteroscopy, hysterectomy or medical therapy.   Surgical expectations and recovery discussed.  Patient wishes to proceed.  An After Visit Summary was printed and given to the patient.

## 2018-08-23 ENCOUNTER — Ambulatory Visit: Payer: Commercial Managed Care - PPO | Admitting: Obstetrics and Gynecology

## 2018-08-23 ENCOUNTER — Encounter: Payer: Self-pay | Admitting: Obstetrics and Gynecology

## 2018-08-23 VITALS — BP 100/76 | HR 84 | Resp 16 | Ht 62.75 in | Wt 139.0 lb

## 2018-08-23 DIAGNOSIS — R9389 Abnormal findings on diagnostic imaging of other specified body structures: Secondary | ICD-10-CM

## 2018-08-23 DIAGNOSIS — N950 Postmenopausal bleeding: Secondary | ICD-10-CM

## 2018-08-24 ENCOUNTER — Other Ambulatory Visit: Payer: Self-pay

## 2018-08-24 ENCOUNTER — Encounter (HOSPITAL_COMMUNITY): Payer: Self-pay

## 2018-08-24 ENCOUNTER — Encounter (HOSPITAL_COMMUNITY)
Admission: RE | Admit: 2018-08-24 | Discharge: 2018-08-24 | Disposition: A | Discharge status: Home / Self Care | Payer: Commercial Managed Care - PPO | Source: Ambulatory Visit | Attending: Obstetrics and Gynecology | Admitting: Obstetrics and Gynecology

## 2018-08-24 DIAGNOSIS — I10 Essential (primary) hypertension: Secondary | ICD-10-CM | POA: Diagnosis not present

## 2018-08-24 DIAGNOSIS — Z01818 Encounter for other preprocedural examination: Secondary | ICD-10-CM | POA: Insufficient documentation

## 2018-08-24 DIAGNOSIS — R001 Bradycardia, unspecified: Secondary | ICD-10-CM | POA: Diagnosis not present

## 2018-08-24 DIAGNOSIS — N858 Other specified noninflammatory disorders of uterus: Secondary | ICD-10-CM | POA: Insufficient documentation

## 2018-08-24 DIAGNOSIS — R9431 Abnormal electrocardiogram [ECG] [EKG]: Secondary | ICD-10-CM | POA: Diagnosis not present

## 2018-08-24 DIAGNOSIS — N950 Postmenopausal bleeding: Secondary | ICD-10-CM | POA: Insufficient documentation

## 2018-08-24 LAB — BASIC METABOLIC PANEL
Anion gap: 6 (ref 5–15)
BUN: 14 mg/dL (ref 6–20)
CHLORIDE: 106 mmol/L (ref 98–111)
CO2: 27 mmol/L (ref 22–32)
CREATININE: 0.64 mg/dL (ref 0.44–1.00)
Calcium: 9.4 mg/dL (ref 8.9–10.3)
GFR calc non Af Amer: >60 mL/min (ref >60)
Glucose, Bld: 99 mg/dL (ref 70–99)
POTASSIUM: 4 mmol/L (ref 3.5–5.1)
Sodium: 139 mmol/L (ref 135–145)

## 2018-08-24 LAB — CBC
HEMATOCRIT: 41.9 % (ref 36.0–46.0)
Hemoglobin: 13.5 g/dL (ref 12.0–15.0)
MCH: 30.8 pg (ref 26.0–34.0)
MCHC: 32.2 g/dL (ref 30.0–36.0)
MCV: 95.4 fL (ref 80.0–100.0)
NRBC: 0 % (ref 0.0–0.2)
Platelets: 256 10*3/uL (ref 150–400)
RBC: 4.39 MIL/uL (ref 3.87–5.11)
RDW: 13.3 % (ref 11.5–15.5)
WBC: 6.1 10*3/uL (ref 4.0–10.5)

## 2018-08-24 NOTE — Progress Notes (Signed)
Final EKG results reviewed with Dr. Marcie Bal, Anesthesiologist. Pt asymptomatic during PAT assessment. Per Dr. Marcie Bal, okay for patient to proceed with surgery.

## 2018-08-24 NOTE — Patient Instructions (Signed)
Barbaraann Milson  08/24/2018      Your procedure is scheduled on 08-30-18    Report to [place]  at  9:00 A.M.    Call this number if you have problems the morning of surgery:[phone]   OUR ADDRESS IS [place], WE ARE LOCATED IN THE [facility] WITH [facility].   Remember:  Do not eat food or drink liquids after midnight.    Take these medicines the morning of surgery with A SIP OF WATER: None    Do not wear jewelry, make-up or nail polish.  Do not wear lotions, powders, or perfumes, or deoderant.  Do not shave 48 hours prior to surgery.     Do not bring valuables to the hospital.  [facility] is not responsible for any belongings or valuables.  Contacts, dentures or bridgework may not be worn into surgery.  Leave your suitcase in the car.  After surgery it may be brought to your room.  For patients admitted to the hospital, discharge time will be determined by your treatment team.  Patients discharged the day of surgery will not be allowed to drive home.   Special instructions:  None  Please read over the following fact sheets that you were given:       [facility] - Preparing for Surgery Before surgery, you can play an important role.  Because skin is not sterile, your skin needs to be as free of germs as possible.  You can reduce the number of germs on your skin by washing with CHG (chlorahexidine gluconate) soap before surgery.  CHG is an antiseptic cleaner which kills germs and bonds with the skin to continue killing germs even after washing. Please DO NOT use if you have an allergy to CHG or antibacterial soaps.  If your skin becomes reddened/irritated stop using the CHG and inform your nurse when you arrive at Short Stay. Do not shave (including legs and underarms) for at least 48 hours prior to the first CHG shower.  You may shave your face/neck. Please follow these instructions carefully:  1.  Shower with CHG Soap the  night before surgery and the  morning of Surgery.  2.  If you choose to wash your hair, wash your hair first as usual with your  normal  shampoo.  3.  After you shampoo, rinse your hair and body thoroughly to remove the  shampoo.                           4.  Use CHG as you would any other liquid soap.  You can apply chg directly  to the skin and wash                       Gently with a scrungie or clean washcloth.  5.  Apply the CHG Soap to your body ONLY FROM THE NECK DOWN.   Do not use on face/ open                           Wound or open sores. Avoid contact with eyes, ears mouth and genitals (private parts).                       Wash face,  Genitals (private parts) with your normal soap.  6.  Wash thoroughly, paying special attention to the area where your surgery  will be performed.  7.  Thoroughly rinse your body with warm water from the neck down.  8.  DO NOT shower/wash with your normal soap after using and rinsing off  the CHG Soap.                9.  Pat yourself dry with a clean towel.            10.  Wear clean pajamas.            11.  Place clean sheets on your bed the night of your first shower and do not  sleep with pets. Day of Surgery : Do not apply any lotions/deodorants the morning of surgery.  Please wear clean clothes to the hospital/surgery center.  FAILURE TO FOLLOW THESE INSTRUCTIONS MAY RESULT IN THE CANCELLATION OF YOUR SURGERY PATIENT SIGNATURE_________________________________  NURSE SIGNATURE__________________________________  ________________________________________________________________________

## 2018-08-26 NOTE — H&P (Signed)
Office Visit   08/23/2018 [facility]    Laurie King, Laurie All, MD  Obstetrics and Gynecology   Postmenopausal bleeding +1 more  Dx   Pre-op Exam   ; Referred by Laurie Melter, MD  Reason for Visit   Additional Documentation   Vitals:   BP 100/76 (BP Location: Right Arm, Patient Position: Sitting, Cuff Size: Large)   Pulse 84   Resp 16   Ht 5' 2.75" (1.594 m)   Wt 63 kg   LMP 12/27/2017 (Approximate)   BMI 24.82 kg/m   BSA 1.67 m     More Vitals   Flowsheets:   MEWS Score,   Anthropometrics     Encounter Info:   Billing Info,   History,   Allergies,   Detailed Report     King Notes   Progress Notes by Laurie Cobbs, MD at 08/23/2018 8:00 AM  Author: Nunzio Cobbs, MD Author Type: Physician Filed: 08/23/2018 8:45 AM  Note Status: Signed Cosign: Cosign Not Required Encounter Date: 08/23/2018  Editor: Laurie Cobbs, MD (Physician)  Prior Versions: 1. Laurie King, Laurie King (Laurie King) at 08/23/2018 8:12 AM - Sign at close encounter   2. Laurie King, Laurie King (Laurie King) at 08/22/2018 10:33 AM - Sign at close encounter    GYNECOLOGY  VISIT   HPI: 52 y.o.   Married  Asian  female   G3P1 with Patient's last menstrual period was 12/27/2017 (approximate).   here for pre op visit for Dilatation and Curettage/Hysteroscopy with Myosure scheduled on 08/30/18.  Husband and interpretor present for the visit today.  No further postmenopausal bleeding.   Has endometrial thickness of 14.61 mm  by ultrasound 06/09/18.  Also has 3 small fibroids.  Ovaries are normal.  EMB showed benign atrophic tissue.   Hx  3 - 4 prior dilation and curettage procedures in [place] for Laurie King.   GYNECOLOGIC HISTORY: Patient's last menstrual period was 12/27/2017 (approximate). Contraception:  Postmenopausal Menopausal hormone therapy:  none Last mammogram: 07-07-18 3D Neg/density c/BiRads1 Last pap  smear: 05-16-18 negative, HR HPV negative                OB History    Gravida  3   Para  1   Term      Preterm      AB  2   Living  1     SAB      TAB  2   Ectopic      Multiple      Live Births                     Patient Active Problem List   Diagnosis Date Noted  . Postmenopausal bleeding 07/06/2018  . Endometrial mass 06/11/2018  . Elevated TSH 05/16/2018        Past Medical History:  Diagnosis Date  . Elevated TSH 2016   normal T4  . Hypertension          Past Surgical History:  Procedure Laterality Date  . DILATION AND CURETTAGE OF UTERUS     3 - 4 interruption of pregnancy - [place].   Marland Kitchen left arm surgery            Current Outpatient Medications  Medication Sig Dispense Refill  . Calcium Carbonate-Vit D-Min (CALCIUM 1200 PO) Take by mouth daily.    . hydrochlorothiazide (MICROZIDE) 12.5 MG capsule Take  12.5 mg by mouth daily.   5  . losartan (COZAAR) 100 MG tablet Take 100 mg by mouth daily.  5  . Multiple Vitamin (MULTIVITAMIN) tablet Take 1 tablet by mouth daily.    . Omega-3 Fatty Acids (FISH OIL) 1000 MG CAPS Take by mouth daily.     No current facility-administered medications for this visit.      ALLERGIES: Patient has no known allergies.       Family History  Problem Relation Age of Onset  . Aneurysm Father     Social History        Socioeconomic History  . Marital status: Married    Spouse name: Not on file  . Number of children: Not on file  . Years of education: Not on file  . Highest education level: Not on file  Occupational History  . Not on file  Social Needs  . Financial resource strain: Not on file  . Food insecurity:    Worry: Not on file    Inability: Not on file  . Transportation needs:    Medical: Not on file    Non-medical: Not on file  Tobacco Use  . Smoking status: Never Smoker  . Smokeless tobacco: Never Used  Substance and Sexual Activity  .  Alcohol use: No    Alcohol/week: 0.0 standard drinks  . Drug use: No  . Sexual activity: Yes    Partners: Male    Birth control/protection: Condom, Post-menopausal  Lifestyle  . Physical activity:    Days per week: Not on file    Minutes per session: Not on file  . Stress: Not on file  Relationships  . Social connections:    Talks on phone: Not on file    Gets together: Not on file    Attends religious service: Not on file    Active member of club or organization: Not on file    Attends meetings of clubs or organizations: Not on file    Relationship status: Not on file  . Intimate partner violence:    Fear of current or ex partner: Not on file    Emotionally abused: Not on file    Physically abused: Not on file    Forced sexual activity: Not on file  Other Topics Concern  . Not on file  Social History Narrative  . Not on file    Review of Systems  King other systems reviewed and are negative.   PHYSICAL EXAMINATION:    BP 100/76 (BP Location: Right Arm, Patient Position: Sitting, Cuff Size: Large)   Pulse 84   Resp 16   Ht 5' 2.75" (1.594 m)   Wt 139 lb (63 kg)   LMP 12/27/2017 (Approximate)   BMI 24.82 kg/m     General appearance: alert, cooperative and appears stated age Head: Normocephalic, without obvious abnormality, atraumatic Neck: no adenopathy, supple, symmetrical, trachea midline and thyroid normal to inspection and palpation Lungs: clear to auscultation bilaterally Heart: regular rate and rhythm Abdomen: soft, non-tender, no masses,  no organomegaly Extremities: extremities normal, atraumatic, no cyanosis or edema Skin: Skin color, texture, turgor normal. No rashes or lesions Lymph nodes: Cervical, supraclavicular, and axillary nodes normal. No abnormal inguinal nodes palpated Neurologic: Grossly normal  Pelvic: External genitalia:  no lesions              Urethra:  normal appearing urethra with no masses,  tenderness or lesions  Bartholins and Skenes: normal                 Vagina: normal appearing vagina with normal color and discharge, no lesions              Cervix: no lesions                Bimanual Exam:  Uterus:  normal size, contour, position, consistency, mobility, non-tender              Adnexa: no mass, fullness, tenderness           Chaperone was present for exam.  ASSESSMENT  Postmenopausal bleeding. Endometrial thickness with atrophy noted on biopsy.  Hx multiple dilation and curettage procedures.   PLAN  Discussion of hysteroscopy with Myosure polypectomy, dilation and curettage.  Risks, benefits, and alternatives reviewed. Risks include but are not limited to bleeding, infection, damage to surrounding organs including uterine perforation requiring hospitalization and laparoscopy, pulmonary edema, DVT, PE, death, need for further treatment and surgery including repeat hysteroscopy, hysterectomy or medical therapy.   Surgical expectations and recovery discussed.  Patient wishes to proceed.  An After Visit Summary was printed and given to the patient.

## 2018-08-30 ENCOUNTER — Ambulatory Visit (HOSPITAL_BASED_OUTPATIENT_CLINIC_OR_DEPARTMENT_OTHER): Payer: Commercial Managed Care - PPO | Admitting: Anesthesiology

## 2018-08-30 ENCOUNTER — Encounter (HOSPITAL_BASED_OUTPATIENT_CLINIC_OR_DEPARTMENT_OTHER): Payer: Self-pay

## 2018-08-30 ENCOUNTER — Ambulatory Visit (HOSPITAL_BASED_OUTPATIENT_CLINIC_OR_DEPARTMENT_OTHER)
Admission: RE | Admit: 2018-08-30 | Discharge: 2018-08-30 | Disposition: A | Discharge status: Home / Self Care | Payer: Commercial Managed Care - PPO | Source: Ambulatory Visit | Attending: Obstetrics and Gynecology | Admitting: Obstetrics and Gynecology

## 2018-08-30 ENCOUNTER — Encounter (HOSPITAL_BASED_OUTPATIENT_CLINIC_OR_DEPARTMENT_OTHER)
Admission: RE | Disposition: A | Discharge status: Home / Self Care | Payer: Self-pay | Source: Ambulatory Visit | Attending: Obstetrics and Gynecology

## 2018-08-30 DIAGNOSIS — N950 Postmenopausal bleeding: Secondary | ICD-10-CM | POA: Insufficient documentation

## 2018-08-30 DIAGNOSIS — R001 Bradycardia, unspecified: Secondary | ICD-10-CM | POA: Diagnosis not present

## 2018-08-30 DIAGNOSIS — Z79899 Other long term (current) drug therapy: Secondary | ICD-10-CM | POA: Diagnosis not present

## 2018-08-30 DIAGNOSIS — I10 Essential (primary) hypertension: Secondary | ICD-10-CM | POA: Diagnosis not present

## 2018-08-30 DIAGNOSIS — N858 Other specified noninflammatory disorders of uterus: Secondary | ICD-10-CM | POA: Diagnosis not present

## 2018-08-30 DIAGNOSIS — N840 Polyp of corpus uteri: Secondary | ICD-10-CM | POA: Diagnosis not present

## 2018-08-30 HISTORY — PX: DILATATION & CURETTAGE/HYSTEROSCOPY WITH MYOSURE: SHX6511

## 2018-08-30 SURGERY — DILATATION & CURETTAGE/HYSTEROSCOPY WITH MYOSURE
Anesthesia: Monitor Anesthesia Care

## 2018-08-30 MED ORDER — MIDAZOLAM HCL 2 MG/2ML IJ SOLN
INTRAMUSCULAR | Status: AC
  Filled 2018-08-30: qty 2

## 2018-08-30 MED ORDER — ONDANSETRON HCL 4 MG/2ML IJ SOLN
INTRAMUSCULAR | Status: DC | PRN
  Administered 2018-08-30: 4 mg via INTRAVENOUS

## 2018-08-30 MED ORDER — LIDOCAINE 2% (20 MG/ML) 5 ML SYRINGE
INTRAMUSCULAR | Status: AC
  Filled 2018-08-30: qty 5

## 2018-08-30 MED ORDER — PROMETHAZINE HCL 25 MG/ML IJ SOLN
6.2500 mg | INTRAMUSCULAR | Status: DC | PRN
  Filled 2018-08-30: qty 1

## 2018-08-30 MED ORDER — DEXAMETHASONE SODIUM PHOSPHATE 10 MG/ML IJ SOLN
INTRAMUSCULAR | Status: AC
  Filled 2018-08-30: qty 1

## 2018-08-30 MED ORDER — KETOROLAC TROMETHAMINE 30 MG/ML IJ SOLN
INTRAMUSCULAR | Status: DC | PRN
  Administered 2018-08-30: 30 mg via INTRAVENOUS

## 2018-08-30 MED ORDER — PROPOFOL 10 MG/ML IV BOLUS
INTRAVENOUS | Status: DC | PRN
  Administered 2018-08-30: 40 mg via INTRAVENOUS

## 2018-08-30 MED ORDER — PROPOFOL 500 MG/50ML IV EMUL
INTRAVENOUS | Status: DC | PRN
  Administered 2018-08-30: 75 ug/kg/min via INTRAVENOUS

## 2018-08-30 MED ORDER — OXYCODONE HCL 5 MG PO TABS
5.0000 mg | ORAL_TABLET | Freq: Once | ORAL | Status: DC | PRN
  Filled 2018-08-30: qty 1

## 2018-08-30 MED ORDER — IBUPROFEN 800 MG PO TABS: 800.0000 mg | ORAL_TABLET | Freq: Three times a day (TID) | ORAL | 0 refills | Status: DC | PRN

## 2018-08-30 MED ORDER — PROPOFOL 10 MG/ML IV BOLUS
INTRAVENOUS | Status: AC
  Filled 2018-08-30: qty 20

## 2018-08-30 MED ORDER — FENTANYL CITRATE (PF) 100 MCG/2ML IJ SOLN
INTRAMUSCULAR | Status: AC
  Filled 2018-08-30: qty 2

## 2018-08-30 MED ORDER — OXYCODONE HCL 5 MG/5ML PO SOLN
5.0000 mg | Freq: Once | ORAL | Status: DC | PRN
  Filled 2018-08-30: qty 5

## 2018-08-30 MED ORDER — DEXAMETHASONE SODIUM PHOSPHATE 10 MG/ML IJ SOLN
INTRAMUSCULAR | Status: DC | PRN
  Administered 2018-08-30: 4 mg via INTRAVENOUS

## 2018-08-30 MED ORDER — LIDOCAINE-EPINEPHRINE 1 %-1:100000 IJ SOLN
INTRAMUSCULAR | Status: DC | PRN
  Administered 2018-08-30: 10 mL

## 2018-08-30 MED ORDER — SODIUM CHLORIDE 0.9 % IR SOLN
Status: DC | PRN
  Administered 2018-08-30: 3000 mL

## 2018-08-30 MED ORDER — HYDROMORPHONE HCL 1 MG/ML IJ SOLN
0.2500 mg | INTRAMUSCULAR | Status: DC | PRN
  Filled 2018-08-30: qty 0.5

## 2018-08-30 MED ORDER — LACTATED RINGERS IV SOLN
INTRAVENOUS | Status: DC
  Administered 2018-08-30: 10:00:00 via INTRAVENOUS
  Filled 2018-08-30: qty 1000

## 2018-08-30 MED ORDER — PROPOFOL 500 MG/50ML IV EMUL
INTRAVENOUS | Status: AC
  Filled 2018-08-30: qty 50

## 2018-08-30 MED ORDER — LIDOCAINE 2% (20 MG/ML) 5 ML SYRINGE
INTRAMUSCULAR | Status: DC | PRN
  Administered 2018-08-30: 50 mg via INTRAVENOUS

## 2018-08-30 MED ORDER — KETOROLAC TROMETHAMINE 30 MG/ML IJ SOLN
INTRAMUSCULAR | Status: AC
  Filled 2018-08-30: qty 1

## 2018-08-30 MED ORDER — SILVER NITRATE-POT NITRATE 75-25 % EX MISC
CUTANEOUS | Status: DC | PRN
  Administered 2018-08-30: 1

## 2018-08-30 MED ORDER — FENTANYL CITRATE (PF) 100 MCG/2ML IJ SOLN
INTRAMUSCULAR | Status: DC | PRN
  Administered 2018-08-30 (×2): 50 ug via INTRAVENOUS

## 2018-08-30 MED ORDER — ONDANSETRON HCL 4 MG/2ML IJ SOLN
INTRAMUSCULAR | Status: AC
  Filled 2018-08-30: qty 2

## 2018-08-30 MED ORDER — MIDAZOLAM HCL 5 MG/5ML IJ SOLN
INTRAMUSCULAR | Status: DC | PRN
  Administered 2018-08-30: 2 mg via INTRAVENOUS

## 2018-08-30 SURGICAL SUPPLY — 17 items
CANISTER SUCT 3000ML PPV (MISCELLANEOUS) ×2 IMPLANT
CATH ROBINSON RED A/P 16FR (CATHETERS) ×2 IMPLANT
COVER WAND RF STERILE (DRAPES) ×2 IMPLANT
DEVICE MYOSURE LITE (MISCELLANEOUS) IMPLANT
DEVICE MYOSURE REACH (MISCELLANEOUS) ×2 IMPLANT
DILATOR CANAL MILEX (MISCELLANEOUS) IMPLANT
GAUZE 4X4 16PLY RFD (DISPOSABLE) ×4 IMPLANT
GLOVE BIO SURGEON STRL SZ 6.5 (GLOVE) ×8 IMPLANT
GOWN STRL REUS W/TWL LRG LVL3 (GOWN DISPOSABLE) ×4 IMPLANT
IV NS IRRIG 3000ML ARTHROMATIC (IV SOLUTION) ×2 IMPLANT
KIT PROCEDURE FLUENT (KITS) ×2 IMPLANT
MYOSURE XL FIBROID (MISCELLANEOUS)
PACK VAGINAL MINOR WOMEN LF (CUSTOM PROCEDURE TRAY) ×2 IMPLANT
PAD OB MATERNITY 4.3X12.25 (PERSONAL CARE ITEMS) ×2 IMPLANT
SEAL ROD LENS SCOPE MYOSURE (ABLATOR) ×2 IMPLANT
SYSTEM TISS REMOVAL MYOSURE XL (MISCELLANEOUS) IMPLANT
TOWEL OR 17X24 6PK STRL BLUE (TOWEL DISPOSABLE) ×4 IMPLANT

## 2018-08-30 NOTE — Anesthesia Postprocedure Evaluation (Signed)
Anesthesia Post Note  Patient: Management consultant  Procedure(s) Performed: DILATATION & CURETTAGE/HYSTEROSCOPY WITH MYOSURE (N/A )     Patient location during evaluation: PACU Anesthesia Type: MAC Level of consciousness: awake and alert Pain management: pain level controlled Vital Signs Assessment: post-procedure vital signs reviewed and stable Respiratory status: spontaneous breathing, nonlabored ventilation, respiratory function stable and patient connected to nasal cannula oxygen Cardiovascular status: stable and blood pressure returned to baseline Postop Assessment: no apparent nausea or vomiting Anesthetic complications: no    Last Vitals:  Vitals:   08/30/18 1300 08/30/18 1345  BP: 126/85 (!) 141/98  Pulse: 64 67  Resp: 13 16  Temp: 37 C 36.6 C  SpO2: 97% 97%    Last Pain:  Vitals:   08/30/18 1345  TempSrc:   PainSc: 1                  Ryan P Ellender

## 2018-08-30 NOTE — Transfer of Care (Signed)
Immediate Anesthesia Transfer of Care Note  Patient: Laurie King  Procedure(s) Performed: DILATATION & CURETTAGE/HYSTEROSCOPY WITH MYOSURE (N/A )  Patient Location: PACU  Anesthesia Type:MAC  Level of Consciousness: awake, alert  and oriented  Airway & Oxygen Therapy: Patient Spontanous Breathing and Patient connected to nasal cannula oxygen  Post-op Assessment: Report given to RN  Post vital signs: Reviewed and stable  Last Vitals: 122/88, 72, 13, 96% Vitals Value Taken Time  BP    Temp    Pulse 74 08/30/2018 12:17 PM  Resp    SpO2 95 % 08/30/2018 12:17 PM  Vitals shown include unvalidated device data.  Last Pain:  Vitals:   08/30/18 0940  TempSrc: Oral  PainSc: 0-No pain      Patients Stated Pain Goal: 6 (37/85/88 5027)  Complications: No apparent anesthesia complications

## 2018-08-30 NOTE — Anesthesia Procedure Notes (Signed)
Procedure Name: MAC Date/Time: 08/30/2018 11:33 AM Performed by: Bonney Aid, CRNA Pre-anesthesia Checklist: Patient identified, Timeout performed, Emergency Drugs available, Suction available and Patient being monitored Patient Re-evaluated:Patient Re-evaluated prior to induction Oxygen Delivery Method: Nasal cannula Placement Confirmation: positive ETCO2

## 2018-08-30 NOTE — Anesthesia Preprocedure Evaluation (Addendum)
Anesthesia Evaluation  Patient identified by MRN, date of birth, ID band Patient awake    Reviewed: Allergy & Precautions, NPO status , Patient's Chart, lab work & pertinent test results  Airway Mallampati: II  TM Distance: >3 FB Neck ROM: Full    Dental  (+) Missing,    Pulmonary neg pulmonary ROS,    Pulmonary exam normal breath sounds clear to auscultation       Cardiovascular hypertension, Pt. on medications Normal cardiovascular exam Rhythm:Regular Rate:Normal  ECG: rate 59. Sinus bradycardia Possible Left atrial enlargement Left axis deviation   Neuro/Psych negative neurological ROS  negative psych ROS   GI/Hepatic negative GI ROS, Neg liver ROS,   Endo/Other  negative endocrine ROS  Renal/GU negative Renal ROS     Musculoskeletal negative musculoskeletal ROS (+)   Abdominal   Peds  Hematology negative hematology ROS (+)   Anesthesia Other Findings PMB, endometrial mass  Reproductive/Obstetrics                            Anesthesia Physical Anesthesia Plan  ASA: II  Anesthesia Plan: MAC   Post-op Pain Management:    Induction:   PONV Risk Score and Plan: 2  Airway Management Planned: Natural Airway  Additional Equipment:   Intra-op Plan:   Post-operative Plan:   Informed Consent: I have reviewed the patients History and Physical, chart, labs and discussed the procedure including the risks, benefits and alternatives for the proposed anesthesia with the patient or authorized representative who has indicated his/her understanding and acceptance.   Dental advisory given  Plan Discussed with: CRNA  Anesthesia Plan Comments:      Anesthesia Quick Evaluation

## 2018-08-30 NOTE — Progress Notes (Signed)
Update to History and Physical  No marked change in status since office preop visit.  EKG showed possible old septal infarct. She denies hx of chest pain or cardiac issue.  Patient examined.  OK to proceed with surgery.  She will see her PCP post op.

## 2018-08-30 NOTE — Discharge Instructions (Signed)
Dilation and Curettage or Vacuum Curettage, Care After These instructions give you information about caring for yourself after your procedure. Your doctor may also give you more specific instructions. Call your doctor if you have any problems or questions after your procedure. Follow these instructions at home: Activity  Do not drive or use heavy machinery while taking prescription pain medicine.  For 24 hours after your procedure, avoid driving.  Take short walks often, followed by rest periods. Ask your doctor what activities are safe for you. After one or two days, you may be able to return to your normal activities.  Do not lift anything that is heavier than 10 lb (4.5 kg) until your doctor approves.  For at least 2 weeks, or as long as told by your doctor: ? Do not douche. ? Do not use tampons. ? Do not have sex. General instructions  Take over-the-counter and prescription medicines only as told by your doctor. This is very important if you take blood thinning medicine.  Do not take baths, swim, or use a hot tub until your doctor approves. Take showers instead of baths.  Wear compression stockings as told by your doctor.  It is up to you to get the results of your procedure. Ask your doctor when your results will be ready.  Keep all follow-up visits as told by your doctor. This is important. Contact a doctor if:  You have very bad cramps that get worse or do not get better with medicine.  You have very bad pain in your belly (abdomen).  You cannot drink fluids without throwing up (vomiting).  You get pain in a different part of the area between your belly and thighs (pelvis).  You have bad-smelling discharge from your vagina.  You have a rash. Get help right away if:  You are bleeding a lot from your vagina. A lot of bleeding means soaking more than one sanitary pad in an hour, for 2 hours in a row.  You have clumps of blood (blood clots) coming from your  vagina.  You have a fever or chills.  Your belly feels very tender or hard.  You have chest pain.  You have trouble breathing.  You cough up blood.  You feel dizzy.  You feel light-headed.  You pass out (faint).  You have pain in your neck or shoulder area. Summary  Take short walks often, followed by rest periods. Ask your doctor what activities are safe for you. After one or two days, you may be able to return to your normal activities.  Do not lift anything that is heavier than 10 lb (4.5 kg) until your doctor approves.  Do not take baths, swim, or use a hot tub until your doctor approves. Take showers instead of baths.  Contact your doctor if you have any symptoms of infection, like bad-smelling discharge from your vagina. This information is not intended to replace advice given to you by your health care provider. Make sure you discuss any questions you have with your health care provider. Document Released: 06/23/2008 Document Revised: 06/01/2016 Document Reviewed: 06/01/2016 Elsevier Interactive Patient Education  2017 [place]. Hysteroscopy, Care After Refer to this sheet in the next few weeks. These instructions provide you with information on caring for yourself after your procedure. Your health care provider may also give you more specific instructions. Your treatment has been planned according to current medical practices, but problems sometimes occur. Call your health care provider if you have any problems or  questions after your procedure. What can I expect after the procedure? After your procedure, it is typical to have the following:  You may have some cramping. This normally lasts for a couple days.  You may have bleeding. This can vary from light spotting for a few days to menstrual-like bleeding for 3-7 days.  Follow these instructions at home:  Rest for the first 1-2 days after the procedure.  Only take over-the-counter or prescription medicines as  directed by your health care provider. Do not take aspirin. It can increase the chances of bleeding.  Take showers instead of baths for 2 weeks or as directed by your health care provider.  Do not drive for 24 hours or as directed.  Do not drink alcohol while taking pain medicine.  Do not use tampons, douche, or have sexual intercourse for 2 weeks or until your health care provider says it is okay.  Take your temperature twice a day for 4-5 days. Write it down each time.  Follow your health care provider's advice about diet, exercise, and lifting.  If you develop constipation, you may: ? Take a mild laxative if your health care provider approves. ? Add bran foods to your diet. ? Drink enough fluids to keep your urine clear or pale yellow.  Try to have someone with you or available to you for the first 24-48 hours, especially if you were given a general anesthetic.  Follow up with your health care provider as directed. Contact a health care provider if:  You feel dizzy or lightheaded.  You feel sick to your stomach (nauseous).  You have abnormal vaginal discharge.  You have a rash.  You have pain that is not controlled with medicine. Get help right away if:  You have bleeding that is heavier than a normal menstrual period.  You have a fever.  You have increasing cramps or pain, not controlled with medicine.  You have new belly (abdominal) pain.  You pass out.  You have pain in the tops of your shoulders (shoulder strap areas).  You have shortness of breath. This information is not intended to replace advice given to you by your health care provider. Make sure you discuss any questions you have with your health care provider. Document Released: 07/05/2013 Document Revised: 02/20/2016 Document Reviewed: 04/13/2013 Elsevier Interactive Patient Education  2017 [place] [facility] Instructions  Activity: Get plenty of rest for the remainder  of the day. A responsible individual must stay with you for 24 hours following the procedure.  For the next 24 hours, DO NOT: -Drive a car -Paediatric nurse -Drink alcoholic beverages -Take any medication unless instructed by your physician -Make any legal decisions or sign important papers.  Meals: Start with liquid foods such as gelatin or soup. Progress to regular foods as tolerated. Avoid greasy, spicy, heavy foods. If nausea and/or vomiting occur, drink only clear liquids until the nausea and/or vomiting subsides. Call your physician if vomiting continues.  Special Instructions/Symptoms: Your throat may feel dry or sore from the anesthesia or the breathing tube placed in your throat during surgery. If this causes discomfort, gargle with warm salt water. The discomfort should disappear within 24 hours.  If you had a scopolamine patch placed behind your ear for the management of post- operative nausea and/or vomiting:  1. The medication in the patch is effective for 72 hours, after which it should be removed.  Wrap patch in a tissue and discard in the  trash. Wash hands thoroughly with soap and water. 2. You may remove the patch earlier than 72 hours if you experience unpleasant side effects which may include dry mouth, dizziness or visual disturbances. 3. Avoid touching the patch. Wash your hands with soap and water after contact with the patch.

## 2018-08-30 NOTE — Op Note (Signed)
OPERATIVE REPORT   PREOPERATIVE DIAGNOSES:   Postmenopausal bleeding, endometrial thickening.  POSTOPERATIVE DIAGNOSES:   Postmenopausal bleeding, endometrial polyp  PROCEDURE:  Hysteroscopy with dilation and curettage and Myosure resection of endometrial polyp  SURGEON:  Lenard Galloway, MD  ANESTHESIA:  LMA, paracervical block with 10 mL of 1% lidocaine.  IV FLUIDS:   --  EBL:  10 cc.  URINE OUTPUT:  10 cc.  NORMAL SALINE DEFICIT:   205 cc.  COMPLICATIONS:  None.  INDICATIONS FOR THE PROCEDURE:     The patient is a 52 y.o. G19P1 Asian female who presents with postmenopausal bleeding. On pelvic ultrasound from 06/09/18, she has an endometrial thickness of 14.61 mm and three small fibroids. Her ovaries are normal.  Office endometrial biopsy showed benign atrophic tissue.  She has a history of three to four prior dilation and curettage procedures in [place] for pregnancy termination.    A plan is now made to proceed with a hysteroscopy with dilation and curettage and Myosure resection of endometrial mass; after risks, benefits and alternatives were reviewed.  FINDINGS:  Exam under anesthesia revealed a small uterus with no adnexal mass. The uterus was sounded to 7 cm. Hysteroscopy showed a large 2.5 x 1 cm polyp that filled the fundus.  Endometrial currettings were scant in quantity.  SPECIMENS:  The endometrial polyp and endometrial curettings were sent to Pathology separately.  PROCEDURE IN DETAIL:  The patient was reidentified in the preoperative hold area.  She received TED hose and PAS stockings for DVT prophylaxis.  In the operating room, the patient was placed in the dorsal lithotomy position and then an LMA anesthetic was introduced.  The patient's lower abdomen, vagina and perineum were sterilely prepped with Betadine and the  patient's bladder was catheterized of urine.  She was sterilly draped  An exam under anesthesia was performed.  A speculum was placed inside  the vagina and a single-tooth tenaculum was placed on the anterior cervical lip.  A paracervical block was performed with a total of 10 mL of 1% lidocaine with epinephrine 1:100,000.  The uterus was  sounded. The cervix was dilated to a #21 Pratt dilator.  The MyoSure hysteroscope was then inserted inside the uterine cavity under the continuous infusion of normal saline solution.  Findings are as noted above.  The MyoSure hysteroscope was removed after the polyp was resected. The base of the polyp  appeared  to have stranding and a rich blood supply.  The cervix was further  dilated to a #23 Pratt dilator.  The serrated curette were introduced into the uterine  cavity and the endometrium was curetted in all 4 quadrants.  A minimal amount of  endometrial curettings was obtained.  This specimen was sent to Pathology.  The single-tooth tenaculum which had been placed on the anterior cervical lip was removed.  Hemostasis was then good, and all of the vaginal  instruments were removed.  The patient was noted to have a small trickle of vaginal bleeding, and the speculum was placed again.  Silver nitrate was used to create hemostasis of the right cervical  tenaculum site.    The patient was awakened and escorted to the recovery room in stable condition after she was cleansed with Betadine.  There were no complications to the procedure.  All needle, instrument and sponge counts were correct.  Lenard Galloway, MD

## 2018-08-31 ENCOUNTER — Encounter (HOSPITAL_BASED_OUTPATIENT_CLINIC_OR_DEPARTMENT_OTHER): Payer: Self-pay | Admitting: Obstetrics and Gynecology

## 2018-09-08 NOTE — Progress Notes (Signed)
GYNECOLOGY  VISIT   HPI: 52 y.o.   Married  Asian  female   [phone] with Patient's last menstrual period was 12/27/2017 (approximate).   here for 2 week follow up [place] (N/A ).   Pathology - benign polyp.  Very little bleeding and cramping post op.  No pain medication use.   Denies hx of chest pain.  States hx of palpitations.   Preop EKG showing possible left atrial enlargement, old septal infarct, and possible ischemia.  No EKG to compare to.   GYNECOLOGIC HISTORY: Patient's last menstrual period was 12/27/2017 (approximate). Contraception: Postmenopausal Menopausal hormone therapy:  none Last mammogram: 07-07-18 3D Neg/density c/BiRads1 Last pap smear: 05-16-18 Neg:Neg HR HPV                             06-07-15 Neg:neg HR HPV        OB History    Gravida  3   Para  1   Term      Preterm      AB  2   Living  1     SAB      TAB  2   Ectopic      Multiple      Live Births                 Patient Active Problem List   Diagnosis Date Noted  . Postmenopausal bleeding 07/06/2018  . Endometrial mass 06/11/2018  . Elevated TSH 05/16/2018    Past Medical History:  Diagnosis Date  . Elevated TSH 2016   normal T4  . Hypertension     Past Surgical History:  Procedure Laterality Date  . DILATATION & CURETTAGE/HYSTEROSCOPY WITH MYOSURE N/A 08/30/2018   Procedure: DILATATION & CURETTAGE/HYSTEROSCOPY WITH MYOSURE;  Surgeon: Nunzio Cobbs, MD;  Location: [facility];  Service: Gynecology;  Laterality: N/A;  . DILATION AND CURETTAGE OF UTERUS     3 - 4 interruption of pregnancy - [place].   Marland Kitchen left arm surgery      Current Outpatient Medications  Medication Sig Dispense Refill  . Calcium Carbonate-Vit D-Min (CALCIUM 1200 PO) Take by mouth daily.    . hydrochlorothiazide (MICROZIDE) 12.5 MG capsule Take 12.5 mg by mouth daily.   5  . ibuprofen (ADVIL,MOTRIN) 800 MG tablet Take 1 tablet  (800 mg total) by mouth every 8 (eight) hours as needed. 30 tablet 0  . losartan (COZAAR) 100 MG tablet Take 100 mg by mouth daily.  5  . Multiple Vitamin (MULTIVITAMIN) tablet Take 1 tablet by mouth daily.    . Omega-3 Fatty Acids (FISH OIL) 1000 MG CAPS Take by mouth daily.     No current facility-administered medications for this visit.      ALLERGIES: Patient has no known allergies.  Family History  Problem Relation Age of Onset  . Aneurysm Father     Social History   Socioeconomic History  . Marital status: Married    Spouse name: Not on file  . Number of children: Not on file  . Years of education: Not on file  . Highest education level: Not on file  Occupational History  . Not on file  Social Needs  . Financial resource strain: Not on file  . Food insecurity:    Worry: Not on file    Inability: Not on file  . Transportation needs:    Medical: Not  on file    Non-medical: Not on file  Tobacco Use  . Smoking status: Never Smoker  . Smokeless tobacco: Never Used  Substance and Sexual Activity  . Alcohol use: Yes    Alcohol/week: 1.0 standard drinks    Types: 1 Glasses of wine per week    Comment: rare  . Drug use: No  . Sexual activity: Yes    Partners: Male    Birth control/protection: Condom, Post-menopausal  Lifestyle  . Physical activity:    Days per week: Not on file    Minutes per session: Not on file  . Stress: Not on file  Relationships  . Social connections:    Talks on phone: Not on file    Gets together: Not on file    Attends religious service: Not on file    Active member of club or organization: Not on file    Attends meetings of clubs or organizations: Not on file    Relationship status: Not on file  . Intimate partner violence:    Fear of current or ex partner: Not on file    Emotionally abused: Not on file    Physically abused: Not on file    Forced sexual activity: Not on file  Other Topics Concern  . Not on file  Social History  Narrative  . Not on file    Review of Systems  All other systems reviewed and are negative.   PHYSICAL EXAMINATION:    BP 112/74 (BP Location: Right Arm, Patient Position: Sitting, Cuff Size: Normal)   Pulse 74   Ht 5' 2.75" (1.594 m)   Wt 137 lb 9.6 oz (62.4 kg)   LMP 12/27/2017 (Approximate)   BMI 24.57 kg/m     General appearance: alert, cooperative and appears stated age   Pelvic: External genitalia:  no lesions              Urethra:  normal appearing urethra with no masses, tenderness or lesions              Bartholins and Skenes: normal                 Vagina: normal appearing vagina with normal color and discharge, no lesions              Cervix: no lesions                Bimanual Exam:  Uterus:  normal size, contour, position, consistency, mobility, non-tender              Adnexa: no mass, fullness, tenderness             Chaperone was present for exam.  ASSESSMENT  Status post hysteroscopy, polypectomy, dilation and curettage.  Preop EKG with abnormal findings.  HTN.  PLAN  Surgical findings, procedure, and pathology report reviewed.  Will have patient see her PCP regarding her EKG.  Call for any future vaginal bleeding and for annual exam.   An After Visit Summary was printed and given to the patient.  __15____ minutes face to face time of which over 50% was spent in counseling.

## 2018-09-12 ENCOUNTER — Ambulatory Visit (INDEPENDENT_AMBULATORY_CARE_PROVIDER_SITE_OTHER): Payer: Commercial Managed Care - PPO | Admitting: Obstetrics and Gynecology

## 2018-09-12 ENCOUNTER — Other Ambulatory Visit: Payer: Self-pay

## 2018-09-12 ENCOUNTER — Encounter: Payer: Self-pay | Admitting: Obstetrics and Gynecology

## 2018-09-12 VITALS — BP 112/74 | HR 74 | Ht 62.75 in | Wt 137.6 lb

## 2018-09-12 DIAGNOSIS — Z9889 Other specified postprocedural states: Secondary | ICD-10-CM | POA: Diagnosis not present

## 2018-09-12 DIAGNOSIS — R9431 Abnormal electrocardiogram [ECG] [EKG]: Secondary | ICD-10-CM

## 2018-09-12 NOTE — Progress Notes (Signed)
Patient scheduled while in office, husband present. Scheduled for f/u of abnormal pre-op EKG with Dr. Olen Pel on 10/19/18 at 8:30am. Patient declines earlier appointments offered due to her work schedule. Patient verbalizes understanding and is agreeable. Copy of today's OV and 08/24/18 EKG faxed to [phone].

## 2018-10-19 DIAGNOSIS — R9431 Abnormal electrocardiogram [ECG] [EKG]: Secondary | ICD-10-CM | POA: Diagnosis not present

## 2018-10-19 DIAGNOSIS — R002 Palpitations: Secondary | ICD-10-CM | POA: Diagnosis not present

## 2018-11-08 ENCOUNTER — Ambulatory Visit: Payer: Commercial Managed Care - PPO | Admitting: Cardiovascular Disease

## 2018-11-08 ENCOUNTER — Encounter: Payer: Self-pay | Admitting: Cardiovascular Disease

## 2018-11-08 DIAGNOSIS — R9431 Abnormal electrocardiogram [ECG] [EKG]: Secondary | ICD-10-CM | POA: Insufficient documentation

## 2018-11-08 DIAGNOSIS — I10 Essential (primary) hypertension: Secondary | ICD-10-CM | POA: Diagnosis not present

## 2018-11-08 NOTE — Assessment & Plan Note (Signed)
History of essential hypertension on losartan and hydrochlorothiazide with blood pressure measured this morning of 98/72.

## 2018-11-08 NOTE — Assessment & Plan Note (Signed)
The patient was referred by Dr. Doyle Askew for an abnormal EKG.  This performed 2 weeks ago and had inferolateral T wave inversion.  She is completely asymptomatic and has essentially no risk factors.  I am going to repeat the EKG today and get a coronary calcium score.  If this is 0 no further work-up is required.  If her coronary calcium score is elevated we will get a coronary CTA to further evaluate.

## 2018-11-08 NOTE — Patient Instructions (Signed)
Medication Instructions:  Your physician recommends that you continue on your current medications as directed. Please refer to the Current Medication list given to you today.  If you need a refill on your cardiac medications before your next appointment, please call your pharmacy.   Lab work: NONE If you have labs (blood work) drawn today and your tests are completely normal, you will receive your results only by: Marland Kitchen MyChart Message (if you have MyChart) OR . A paper copy in the mail If you have any lab test that is abnormal or we need to change your treatment, we will call you to review the results.  Testing/Procedures: YOUR PHYSICIAN RECOMMENDS THE FOLLOWING:  Coronary Calcium Scan A coronary calcium scan is an imaging test used to look for deposits of calcium and other fatty materials (plaques) in the inner lining of the blood vessels of the heart (coronary arteries). These deposits of calcium and plaques can partly clog and narrow the coronary arteries without producing any symptoms or warning signs. This puts a person at risk for a heart attack. This test can detect these deposits before symptoms develop. Tell a health care provider about:  Any allergies you have.  All medicines you are taking, including vitamins, herbs, eye drops, creams, and over-the-counter medicines.  Any problems you or family members have had with anesthetic medicines.  Any blood disorders you have.  Any surgeries you have had.  Any medical conditions you have.  Whether you are pregnant or may be pregnant. What are the risks? Generally, this is a safe procedure. However, problems may occur, including:  Harm to a pregnant woman and her unborn baby. This test involves the use of radiation. Radiation exposure can be dangerous to a pregnant woman and her unborn baby. If you are pregnant, you generally should not have this procedure done.  Slight increase in the risk of cancer. This is because of the  radiation involved in the test. What happens before the procedure? No preparation is needed for this procedure. What happens during the procedure?   You will undress and remove any jewelry around your neck or chest.  You will put on a hospital gown.  Sticky electrodes will be placed on your chest. The electrodes will be connected to an electrocardiogram (ECG) machine to record a tracing of the electrical activity of your heart.  A CT scanner will take pictures of your heart. During this time, you will be asked to lie still and hold your breath for 2-3 seconds while a picture of your heart is being taken. The procedure may vary among health care providers and hospitals. What happens after the procedure?  You can get dressed.  You can return to your normal activities.  It is up to you to get the results of your test. Ask your health care provider, or the department that is doing the test, when your results will be ready. Summary  A coronary calcium scan is an imaging test used to look for deposits of calcium and other fatty materials (plaques) in the inner lining of the blood vessels of the heart (coronary arteries).  Generally, this is a safe procedure. Tell your health care provider if you are pregnant or may be pregnant.  No preparation is needed for this procedure.  A CT scanner will take pictures of your heart.  You can return to your normal activities after the scan is done. This information is not intended to replace advice given to you by your health care  provider. Make sure you discuss any questions you have with your health care provider.    Follow-Up: At [facility], you and your health needs are our priority.  As part of our continuing mission to provide you with exceptional heart care, we have created designated Provider Care Teams.  These Care Teams include your primary Cardiologist (physician) and Advanced Practice Providers (APPs -  Physician Assistants and Nurse  Practitioners) who all work together to provide you with the care you need, when you need it. . You may schedule a follow up appointment AS NEEDED pending the results of your coronary calcium scan. You may see Dr. Gwenlyn Found or one of the following Advanced Practice Providers on your designated Care Team:   . Kerin Ransom, [place] . Almyra Deforest, PA-C . Fabian Sharp, PA-C . Jory Sims, DNP . Rosaria Ferries, PA-C . Roby Lofts, PA-C . Sande Rives, PA-C

## 2018-11-08 NOTE — Progress Notes (Signed)
11/08/2018 Laurie King   11-18-1965  [id number]  Primary Physician Orpah Melter, MD Primary Cardiologist: Lorretta Harp MD Lupe Carney, [place]  HPI:  Laurie King is a 53 y.o. married [place] female mother of 74 year old daughter, grandmother of 4 grandchildren referred by Dr. Doyle Askew for cardiovascular evaluation because of an abnormal EKG.  She works in Teaching laboratory technician support in Therapist, art at [employer].  She has no risk factors other than treated hypertension.  She gets occasional palpitations but these are rare.  There is no family history for heart disease.  She is never had a heart attack or stroke.  She denies chest pain or shortness of breath.  A recent EKG performed 2 weeks ago showed inferolateral T wave inversion.   Current Meds  Medication Sig  . Calcium Carbonate-Vit D-Min (CALCIUM 1200 PO) Take by mouth daily.  . hydrochlorothiazide (MICROZIDE) 12.5 MG capsule Take 12.5 mg by mouth daily.   Marland Kitchen losartan (COZAAR) 100 MG tablet Take 100 mg by mouth daily.  . Multiple Vitamin (MULTIVITAMIN) tablet Take 1 tablet by mouth daily.  . Omega-3 Fatty Acids (FISH OIL) 1000 MG CAPS Take by mouth daily.  . [DISCONTINUED] ibuprofen (ADVIL,MOTRIN) 800 MG tablet Take 1 tablet (800 mg total) by mouth every 8 (eight) hours as needed.     No Known Allergies  Social History   Socioeconomic History  . Marital status: Married    Spouse name: Not on file  . Number of children: Not on file  . Years of education: Not on file  . Highest education level: Not on file  Occupational History  . Not on file  Social Needs  . Financial resource strain: Not on file  . Food insecurity:    Worry: Not on file    Inability: Not on file  . Transportation needs:    Medical: Not on file    Non-medical: Not on file  Tobacco Use  . Smoking status: Never Smoker  . Smokeless tobacco: Never Used  Substance and Sexual Activity  . Alcohol use: Yes    Alcohol/week: 1.0 standard  drinks    Types: 1 Glasses of wine per week    Comment: rare  . Drug use: No  . Sexual activity: Yes    Partners: Male    Birth control/protection: Condom, Post-menopausal  Lifestyle  . Physical activity:    Days per week: Not on file    Minutes per session: Not on file  . Stress: Not on file  Relationships  . Social connections:    Talks on phone: Not on file    Gets together: Not on file    Attends religious service: Not on file    Active member of club or organization: Not on file    Attends meetings of clubs or organizations: Not on file    Relationship status: Not on file  . Intimate partner violence:    Fear of current or ex partner: Not on file    Emotionally abused: Not on file    Physically abused: Not on file    Forced sexual activity: Not on file  Other Topics Concern  . Not on file  Social History Narrative  . Not on file     Review of Systems: General: negative for chills, fever, night sweats or weight changes.  Cardiovascular: negative for chest pain, dyspnea on exertion, edema, orthopnea, palpitations, paroxysmal nocturnal dyspnea or shortness of breath Dermatological: negative for rash Respiratory: negative for cough or  wheezing Urologic: negative for hematuria Abdominal: negative for nausea, vomiting, diarrhea, bright red blood per rectum, melena, or hematemesis Neurologic: negative for visual changes, syncope, or dizziness All other systems reviewed and are otherwise negative except as noted above.    Blood pressure 98/72, pulse 84, height 5\' 3"  (1.6 m), weight 141 lb 3.2 oz (64 kg), last menstrual period 12/27/2017.  General appearance: alert and no distress Neck: no adenopathy, no carotid bruit, no JVD, supple, symmetrical, trachea midline and thyroid not enlarged, symmetric, no tenderness/mass/nodules Lungs: clear to auscultation bilaterally Heart: regular rate and rhythm, S1, S2 normal, no murmur, click, rub or gallop Extremities: extremities  normal, atraumatic, no cyanosis or edema Pulses: 2+ and symmetric Skin: Skin color, texture, turgor normal. No rashes or lesions Neurologic: Alert and oriented X 3, normal strength and tone. Normal symmetric reflexes. Normal coordination and gait  EKG normal sinus rhythm at 70 with inferolateral T wave inversion and left axis deviation.  I personally reviewed this EKG.  ASSESSMENT AND PLAN:   Essential hypertension History of essential hypertension on losartan and hydrochlorothiazide with blood pressure measured this morning of 98/72.  Abnormal EKG The patient was referred by Dr. Doyle Askew for an abnormal EKG.  This performed 2 weeks ago and had inferolateral T wave inversion.  She is completely asymptomatic and has essentially no risk factors.  I am going to repeat the EKG today and get a coronary calcium score.  If this is 0 no further work-up is required.  If her coronary calcium score is elevated we will get a coronary CTA to further evaluate.      Lorretta Harp MD FACP,FACC,FAHA, [facility] 11/08/2018 8:31 AM

## 2018-11-28 ENCOUNTER — Inpatient Hospital Stay: Admission: RE | Admit: 2018-11-28 | Payer: Commercial Managed Care - PPO | Source: Ambulatory Visit

## 2018-12-06 ENCOUNTER — Ambulatory Visit (INDEPENDENT_AMBULATORY_CARE_PROVIDER_SITE_OTHER)
Admission: RE | Admit: 2018-12-06 | Discharge: 2018-12-06 | Disposition: A | Discharge status: Home / Self Care | Payer: Self-pay | Source: Ambulatory Visit | Attending: Cardiovascular Disease | Admitting: Cardiovascular Disease

## 2018-12-06 DIAGNOSIS — R9431 Abnormal electrocardiogram [ECG] [EKG]: Secondary | ICD-10-CM

## 2019-12-15 ENCOUNTER — Ambulatory Visit: Payer: Self-pay | Attending: Internal Medicine

## 2019-12-15 DIAGNOSIS — Z23 Encounter for immunization: Secondary | ICD-10-CM

## 2019-12-15 NOTE — Progress Notes (Signed)
   Covid-19 Vaccination Clinic  Name:  Laurie King    MRN: [id number] DOB: 29-Jun-1966  12/15/2019  [name] was observed post Covid-19 immunization for 15 minutes without incident. She was provided with Vaccine Information Sheet and instruction to access the V-Safe system.   [name] was instructed to call 911 with any severe reactions post vaccine: Marland Kitchen Difficulty breathing  . Swelling of face and throat  . A fast heartbeat  . A bad rash all over body  . Dizziness and weakness   Immunizations Administered    Name Date Dose VIS Date Route   Pfizer COVID-19 Vaccine 12/15/2019  8:19 AM 0.3 mL 09/08/2019 Intramuscular   Manufacturer: [place]   Lot: [id number]   [place]: [id number]

## 2020-01-10 ENCOUNTER — Ambulatory Visit: Payer: Self-pay | Attending: Internal Medicine

## 2020-01-10 DIAGNOSIS — Z23 Encounter for immunization: Secondary | ICD-10-CM

## 2020-01-10 NOTE — Progress Notes (Signed)
   Covid-19 Vaccination Clinic  Name:  Angline Prestidge    MRN: [id number] DOB: [birth year]  01/10/2020  Ms. Gingerich was observed post Covid-19 immunization for 15 minutes without incident. She was provided with Vaccine Information Sheet and instruction to access the V-Safe system.   Ms. Figura was instructed to call 911 with any severe reactions post vaccine: Marland Kitchen Difficulty breathing  . Swelling of face and throat  . A fast heartbeat  . A bad rash all over body  . Dizziness and weakness   Immunizations Administered    Name Date Dose VIS Date Route   Pfizer COVID-19 Vaccine 01/10/2020  8:16 AM 0.3 mL 09/08/2019 Intramuscular   Manufacturer: [place]   Lot: [id number]   [place]: [id number]

## 2020-08-07 ENCOUNTER — Other Ambulatory Visit: Payer: Self-pay | Admitting: Obstetrics and Gynecology

## 2020-08-07 DIAGNOSIS — Z1231 Encounter for screening mammogram for malignant neoplasm of breast: Secondary | ICD-10-CM

## 2020-08-16 ENCOUNTER — Other Ambulatory Visit: Payer: Self-pay

## 2020-08-16 ENCOUNTER — Ambulatory Visit
Admission: RE | Admit: 2020-08-16 | Discharge: 2020-08-16 | Disposition: A | Discharge status: Home / Self Care | Payer: Managed Care, Other (non HMO) | Source: Ambulatory Visit

## 2020-08-16 DIAGNOSIS — Z1231 Encounter for screening mammogram for malignant neoplasm of breast: Secondary | ICD-10-CM

## 2022-06-24 ENCOUNTER — Other Ambulatory Visit: Payer: Self-pay | Admitting: Obstetrics and Gynecology

## 2022-06-24 DIAGNOSIS — Z1231 Encounter for screening mammogram for malignant neoplasm of breast: Secondary | ICD-10-CM

## 2022-06-25 ENCOUNTER — Encounter (HOSPITAL_COMMUNITY): Payer: Self-pay

## 2022-06-25 ENCOUNTER — Emergency Department (HOSPITAL_COMMUNITY)
Admission: EM | Admit: 2022-06-25 | Discharge: 2022-06-25 | Disposition: A | Discharge status: Home / Self Care | Payer: Managed Care, Other (non HMO) | Attending: Emergency Medicine | Admitting: Emergency Medicine

## 2022-06-25 DIAGNOSIS — R109 Unspecified abdominal pain: Secondary | ICD-10-CM | POA: Diagnosis present

## 2022-06-25 DIAGNOSIS — T620X1A Toxic effect of ingested mushrooms, accidental (unintentional), initial encounter: Secondary | ICD-10-CM | POA: Diagnosis not present

## 2022-06-25 LAB — COMPREHENSIVE METABOLIC PANEL
ALT: 18 U/L (ref 0–44)
AST: 25 U/L (ref 15–41)
Albumin: 4.6 g/dL (ref 3.5–5.0)
Alkaline Phosphatase: 57 U/L (ref 38–126)
Anion gap: 8 (ref 5–15)
BUN: 16 mg/dL (ref 6–20)
CO2: 21 mmol/L — ABNORMAL LOW (ref 22–32)
Calcium: 10 mg/dL (ref 8.9–10.3)
Chloride: 111 mmol/L (ref 98–111)
Creatinine, Ser: 0.71 mg/dL (ref 0.44–1.00)
GFR, Estimated: >60 mL/min (ref >60)
Glucose, Bld: 114 mg/dL — ABNORMAL HIGH (ref 70–99)
Potassium: 4.1 mmol/L (ref 3.5–5.1)
Sodium: 140 mmol/L (ref 135–145)
Total Bilirubin: 0.8 mg/dL (ref 0.3–1.2)
Total Protein: 8.3 g/dL — ABNORMAL HIGH (ref 6.5–8.1)

## 2022-06-25 LAB — CBC WITH DIFFERENTIAL/PLATELET
Abs Immature Granulocytes: 0.03 10*3/uL (ref 0.00–0.07)
Basophils Absolute: 0 10*3/uL (ref 0.0–0.1)
Basophils Relative: 0 %
Eosinophils Absolute: 0.1 10*3/uL (ref 0.0–0.5)
Eosinophils Relative: 1 %
HCT: 43.9 % (ref 36.0–46.0)
Hemoglobin: 14.6 g/dL (ref 12.0–15.0)
Immature Granulocytes: 0 %
Lymphocytes Relative: 11 %
Lymphs Abs: 1.1 10*3/uL (ref 0.7–4.0)
MCH: 31.1 pg (ref 26.0–34.0)
MCHC: 33.3 g/dL (ref 30.0–36.0)
MCV: 93.4 fL (ref 80.0–100.0)
Monocytes Absolute: 0.2 10*3/uL (ref 0.1–1.0)
Monocytes Relative: 2 %
Neutro Abs: 8.1 10*3/uL — ABNORMAL HIGH (ref 1.7–7.7)
Neutrophils Relative %: 86 %
Platelets: 263 10*3/uL (ref 150–400)
RBC: 4.7 MIL/uL (ref 3.87–5.11)
RDW: 13.2 % (ref 11.5–15.5)
WBC: 9.5 10*3/uL (ref 4.0–10.5)
nRBC: 0 % (ref 0.0–0.2)

## 2022-06-25 LAB — SALICYLATE LEVEL: Salicylate Lvl: <7 mg/dL — ABNORMAL LOW (ref 7.0–30.0)

## 2022-06-25 LAB — LIPASE, BLOOD: Lipase: 32 U/L (ref 11–51)

## 2022-06-25 LAB — ACETAMINOPHEN LEVEL: Acetaminophen (Tylenol), Serum: <10 ug/mL — ABNORMAL LOW (ref 10–30)

## 2022-06-25 MED ORDER — ONDANSETRON 4 MG PO TBDP
4.0000 mg | ORAL_TABLET | Freq: Once | ORAL | Status: AC
  Administered 2022-06-25: 4 mg via ORAL
  Filled 2022-06-25: qty 1

## 2022-06-25 MED ORDER — ONDANSETRON HCL 4 MG PO TABS: 4.0000 mg | ORAL_TABLET | Freq: Three times a day (TID) | ORAL | 0 refills | Status: AC | PRN

## 2022-06-25 NOTE — Discharge Instructions (Signed)
Get help right away if: You have trouble breathing. You become dizzy. You are confused or disoriented. You lose consciousness or have a seizure.

## 2022-06-25 NOTE — ED Triage Notes (Signed)
Pt states that yesterday at 2000 she ate a mushroom from her yard and approximately 12 hours later she began having N/V/D with abd pain.

## 2022-06-25 NOTE — ED Provider Triage Note (Signed)
Emergency Medicine Provider Triage Evaluation Note  Laurie King , a 56 y.o. female  was evaluated in triage.  Pt complains of abdominal pain nausea and vomiting onset 12 hours after eating a mushroom out of her front yard..  Review of Systems  Positive: Vomiting Negative: Fever  Physical Exam  BP (!) 132/94 (BP Location: Left Arm)   Pulse 81   Temp 97.8 F (36.6 C)   Resp 16   LMP 12/27/2017 (Approximate)   SpO2 100%  Gen:   Awake, no distress   Resp:  Normal effort  MSK:   Moves extremities without difficulty  Other:  No active vomiting on initial evaluation  Medical Decision Making  Medically screening exam initiated at 2:26 PM.  Appropriate orders placed.  Laurie King was informed that the remainder of the evaluation will be completed by another provider, this initial triage assessment does not replace that evaluation, and the importance of remaining in the ED until their evaluation is complete.  Work-up initiated   Laurie Mail, PA-C 06/25/22 1426

## 2022-06-25 NOTE — ED Provider Notes (Signed)
[facility] DEPT Provider Note   CSN: [id number] Arrival date & time: 06/25/22  1400     History  Chief Complaint  Patient presents with   Abdominal Pain   Nausea    Laurie King is a 56 y.o. female who presents for n/v/d after eating a mushroom from her front yard. Patient ate the mushroom at 8pm last night. She awoke with abdominal cramping, watery diarrhea and several episodes of nbnb vomiting. She has no other complaints. She denies contacts with similar sxs, history of similar sxs, recent foreign travel .     Abdominal Pain      Home Medications Prior to Admission medications   Medication Sig Start Date End Date Taking? Authorizing Provider  ondansetron (ZOFRAN) 4 MG tablet Take 1 tablet (4 mg total) by mouth every 8 (eight) hours as needed for nausea or vomiting. 06/25/22  Yes Laken Rog, PA-C  Calcium Carbonate-Vit D-Min (CALCIUM 1200 PO) Take by mouth daily.    [provider]  hydrochlorothiazide (MICROZIDE) 12.5 MG capsule Take 12.5 mg by mouth daily.  04/30/18   [provider]  losartan (COZAAR) 100 MG tablet Take 100 mg by mouth daily. 04/30/18   [provider]  Multiple Vitamin (MULTIVITAMIN) tablet Take 1 tablet by mouth daily.    [provider]  Omega-3 Fatty Acids (FISH OIL) 1000 MG CAPS Take by mouth daily.    [provider]      Allergies    Patient has no known allergies.    Review of Systems   Review of Systems  Gastrointestinal:  Positive for abdominal pain.    Physical Exam Updated Vital Signs BP 138/85   Pulse 72   Temp 98.1 F (36.7 C) (Oral)   Resp 16   LMP 12/27/2017 (Approximate)   SpO2 98%  Physical Exam Vitals and nursing note reviewed.  Constitutional:      General: She is not in acute distress.    Appearance: She is well-developed. She is not diaphoretic.  HENT:     Head: Normocephalic and atraumatic.     Right Ear: External ear normal.      Left Ear: External ear normal.     Nose: Nose normal.     Mouth/Throat:     Mouth: Mucous membranes are moist.  Eyes:     General: No scleral icterus.    Conjunctiva/sclera: Conjunctivae normal.  Cardiovascular:     Rate and Rhythm: Normal rate and regular rhythm.     Heart sounds: Normal heart sounds. No murmur heard.    No friction rub. No gallop.  Pulmonary:     Effort: Pulmonary effort is normal. No respiratory distress.     Breath sounds: Normal breath sounds.  Abdominal:     General: Bowel sounds are normal. There is no distension.     Palpations: Abdomen is soft. There is no mass.     Tenderness: There is no abdominal tenderness. There is no guarding.  Musculoskeletal:     Cervical back: Normal range of motion.  Skin:    General: Skin is warm and dry.  Neurological:     Mental Status: She is alert and oriented to person, place, and time.  Psychiatric:        Behavior: Behavior normal.     ED Results / Procedures / Treatments   Labs (all labs ordered are listed, but only abnormal results are displayed) Labs Reviewed  COMPREHENSIVE METABOLIC PANEL - Abnormal; Notable for the following  components:      Result Value   CO2 21 (*)    Glucose, Bld 114 (*)    Total Protein 8.3 (*)    All other components within normal limits  CBC WITH DIFFERENTIAL/PLATELET - Abnormal; Notable for the following components:   Neutro Abs 8.1 (*)    All other components within normal limits  SALICYLATE LEVEL - Abnormal; Notable for the following components:   Salicylate Lvl <9.6 (*)    All other components within normal limits  ACETAMINOPHEN LEVEL - Abnormal; Notable for the following components:   Acetaminophen (Tylenol), Serum <10 (*)    All other components within normal limits  LIPASE, BLOOD    EKG None  Radiology No results found.  Procedures Procedures    Medications Ordered in ED Medications  ondansetron (ZOFRAN-ODT) disintegrating tablet 4 mg (4 mg Oral Given 06/25/22  2107)    ED Course/ Medical Decision Making/ A&P                           Medical Decision Making Amount and/or Complexity of Data Reviewed Labs: ordered.  Risk Prescription drug management.  Patient here after wild mushroom ingestion from her yard. Onset of nausea vomiting and epigastric pain along with diarrhea approximately 12 hours after eating this. It has been at this ER for approximately 8 hours.  She is now greater than 24 hours out from having eaten the mushroom.  I ordered and reviewed labs including CBC, CMP Tylenol and salicylate levels.  She has no evidence of hepatic injury.  She has had no active vomiting here in the emergency department.  Her abdominal pain is significantly improved.  At this point I do not think she is at risk for any fulminant liver failure.  She is otherwise hemodynamically stable and appropriate for discharge.  Will discharge with Zofran.  Over-the-counter Imodium for diarrhea.  Discussed outpatient follow-up and return precautions.        Final Clinical Impression(s) / ED Diagnoses Final diagnoses:  Toxic effect of ingested mushroom, unintentional, initial encounter    Rx / DC Orders ED Discharge Orders          Ordered    ondansetron (ZOFRAN) 4 MG tablet  Every 8 hours PRN        06/25/22 2059              Margarita Mail, PA-C 06/26/22 2051    Valarie Merino, MD 06/27/22 [phone]

## 2022-07-07 ENCOUNTER — Ambulatory Visit
Admission: RE | Admit: 2022-07-07 | Discharge: 2022-07-07 | Disposition: A | Discharge status: Home / Self Care | Payer: Managed Care, Other (non HMO) | Source: Ambulatory Visit | Attending: Obstetrics and Gynecology | Admitting: Obstetrics and Gynecology

## 2022-07-07 DIAGNOSIS — Z1231 Encounter for screening mammogram for malignant neoplasm of breast: Secondary | ICD-10-CM

## 2022-09-06 IMAGING — MG DIGITAL SCREENING BILAT W/ TOMO W/ CAD
8 series · 9 of 24 positions shown · non-contrast
Comparison: Previous exam(s).

CLINICAL DATA: Screening.

EXAM:
DIGITAL SCREENING BILATERAL MAMMOGRAM WITH TOMO AND CAD

[L CC synth-2D]
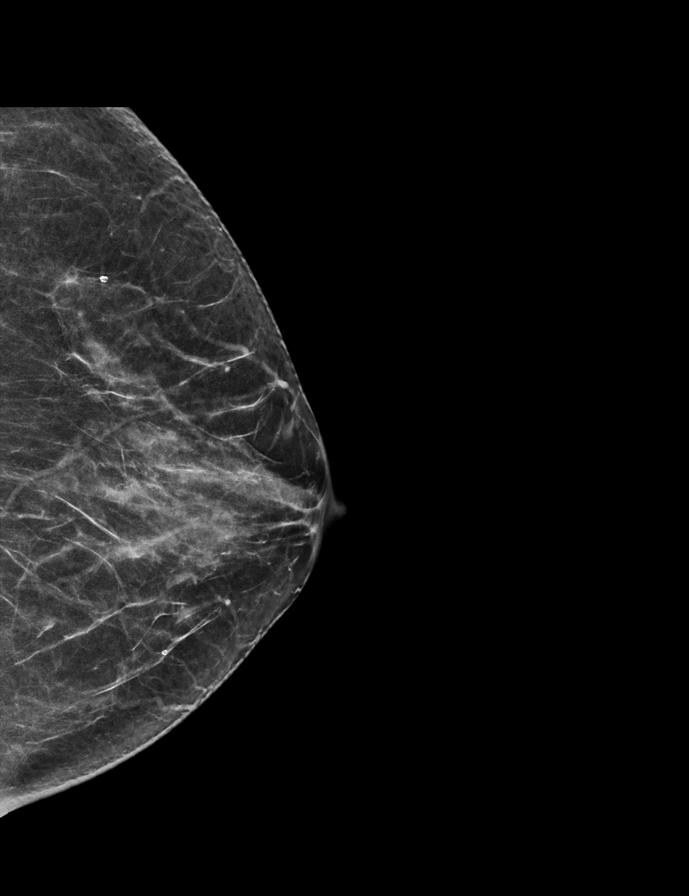

[R CC synth-2D]
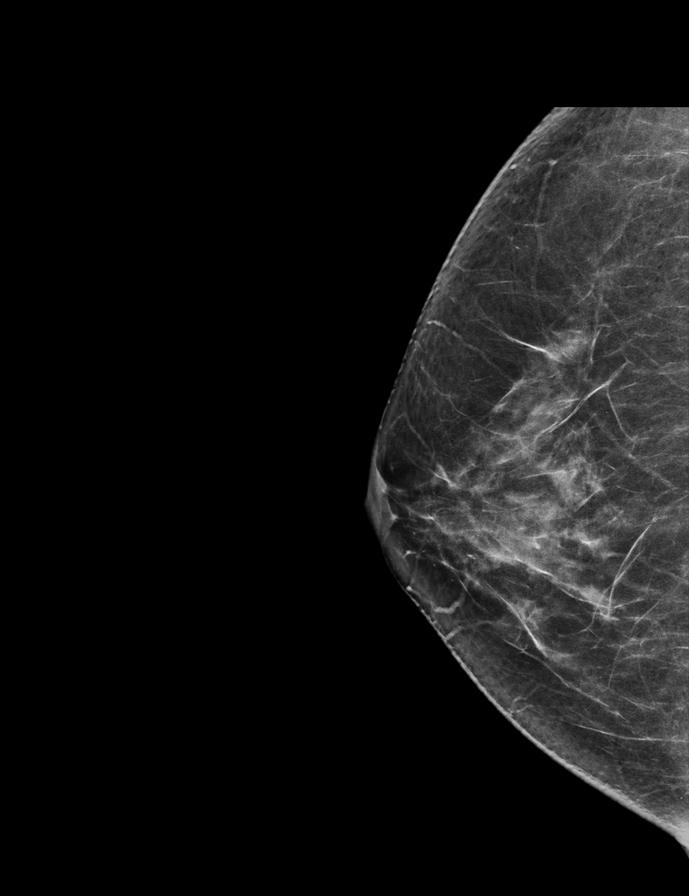

[R MLO synth-2D]
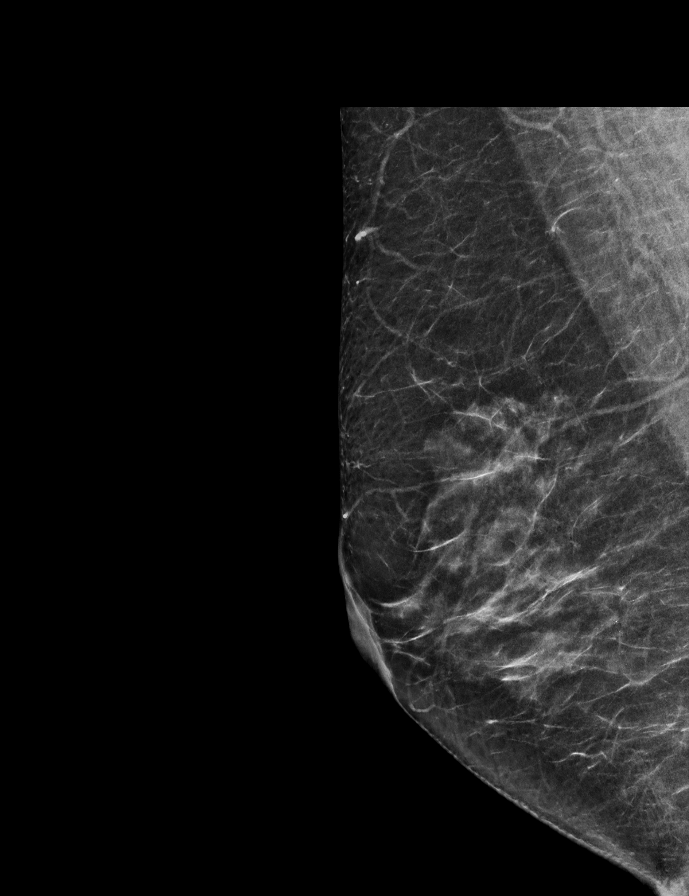

[L MLO synth-2D]
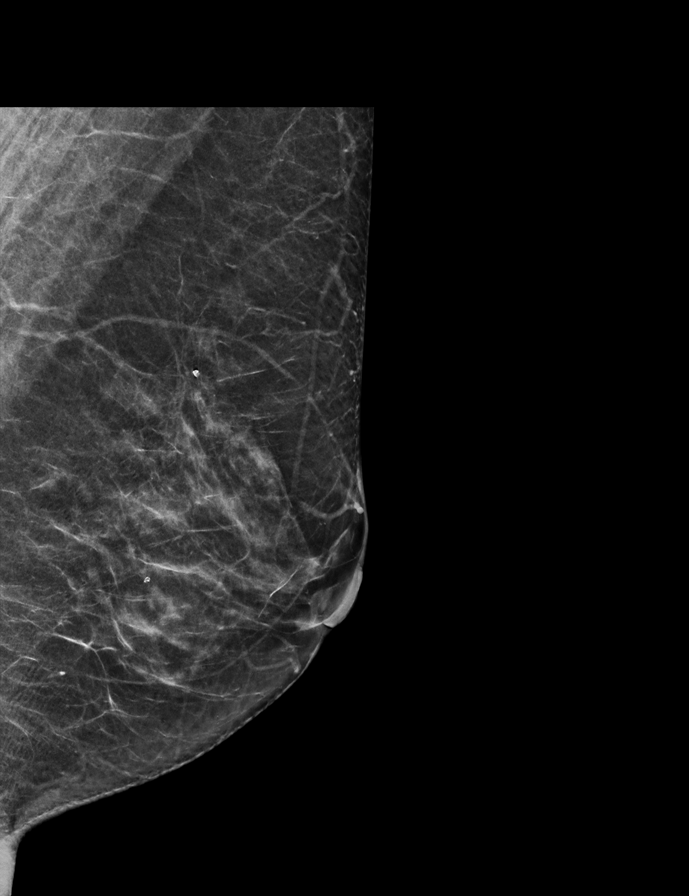

[L CC tomo · 2 of 58 frames shown]
[frame 19/58]
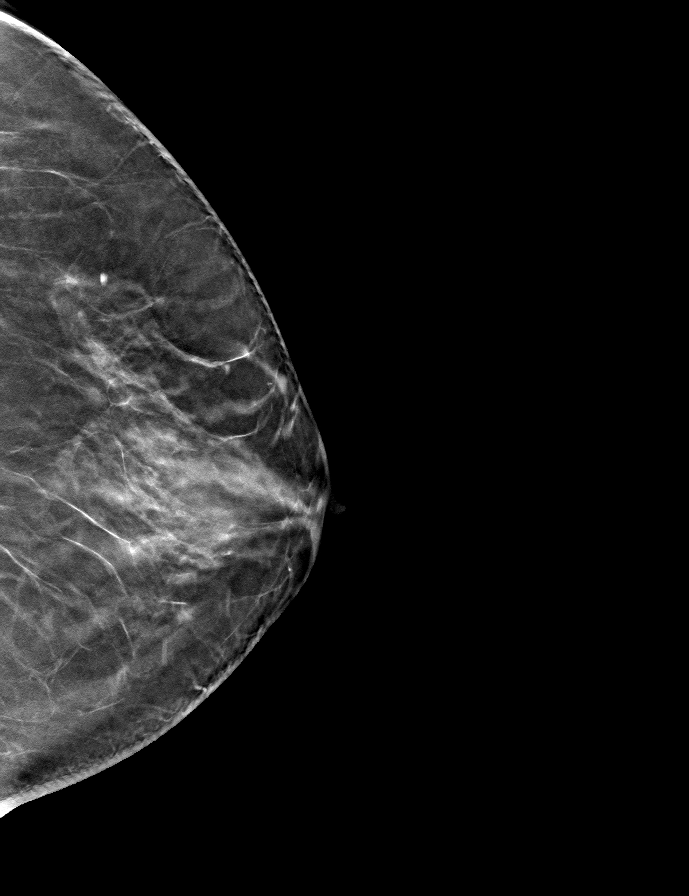
[frame 29/58]
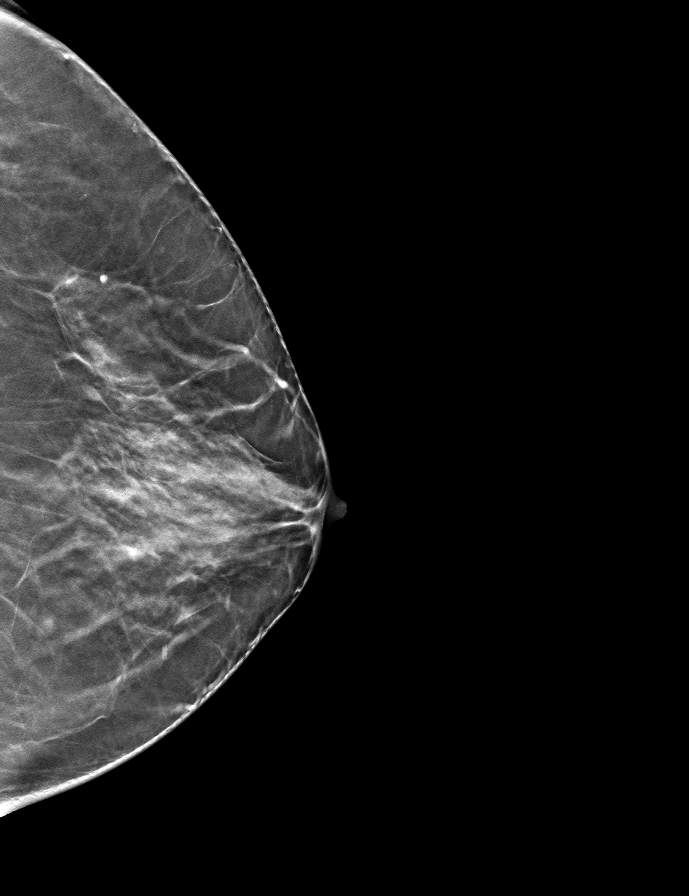

[R CC tomo · tomo slice 33/66.0]
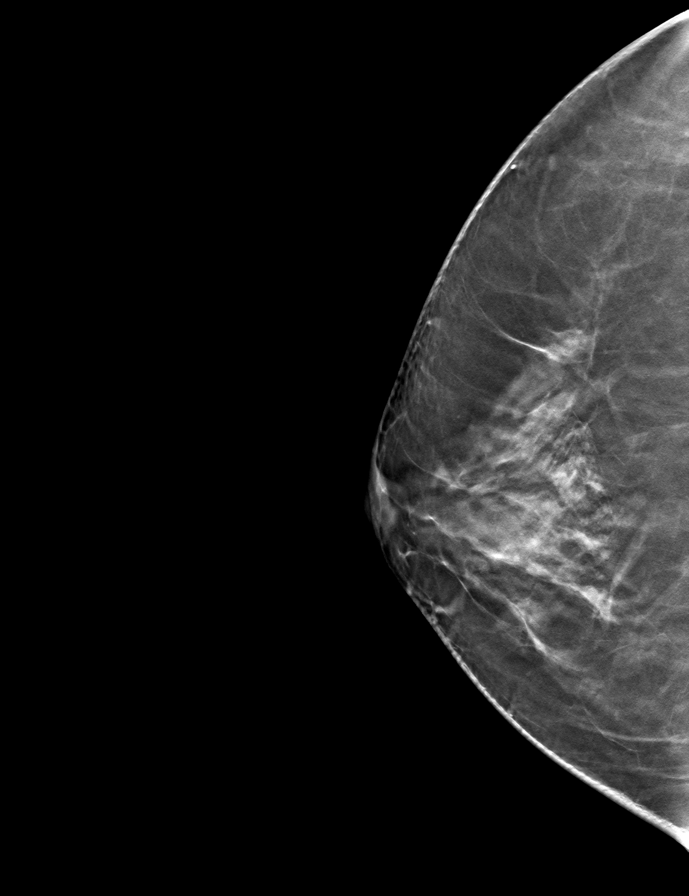

[L MLO tomo · tomo slice 31/62.0]
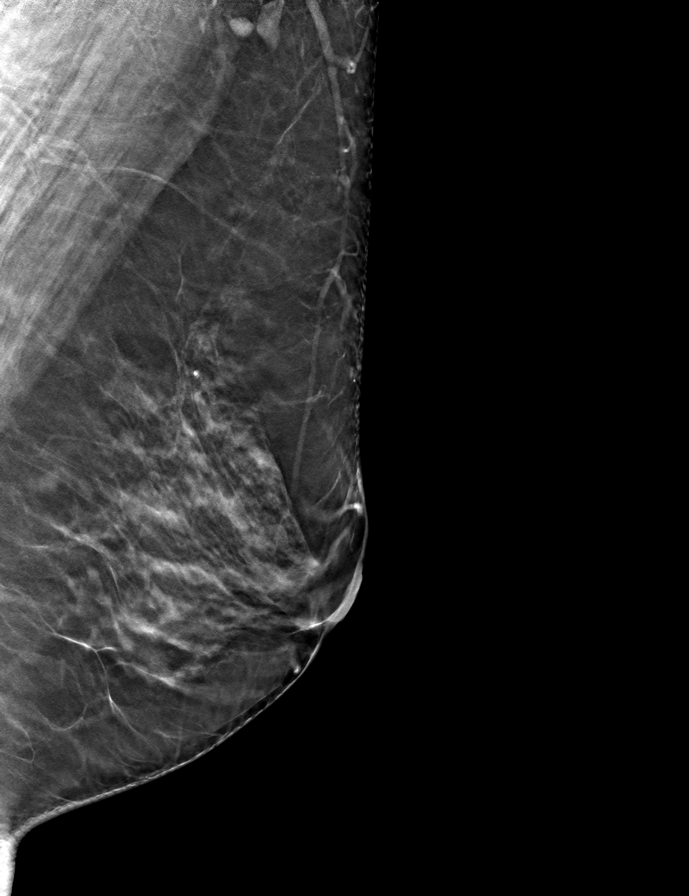

[R MLO tomo · tomo slice 31/60.0]
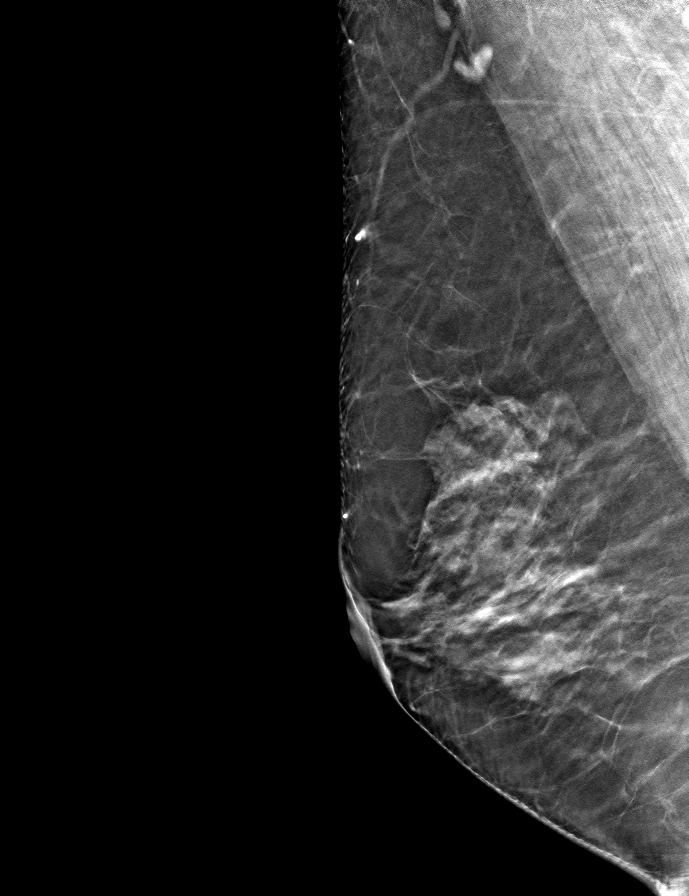

[9 of 24 positions shown; findings below may reference images not displayed]

ACR Breast Density Category c: The breast tissue is heterogeneously
dense, which may obscure small masses.
FINDINGS: There are no findings suspicious for malignancy. Images were
processed with CAD.
IMPRESSION: No mammographic evidence of malignancy. A result letter of this
screening mammogram will be mailed directly to the patient.

RECOMMENDATION:
Screening mammogram in one year. (Code:[id number]-U-LHB)

BI-RADS CATEGORY  1: Negative.

## 2023-07-06 ENCOUNTER — Other Ambulatory Visit (HOSPITAL_BASED_OUTPATIENT_CLINIC_OR_DEPARTMENT_OTHER): Payer: Self-pay | Admitting: Family Medicine

## 2023-07-06 DIAGNOSIS — N281 Cyst of kidney, acquired: Secondary | ICD-10-CM

## 2023-07-20 ENCOUNTER — Ambulatory Visit (HOSPITAL_BASED_OUTPATIENT_CLINIC_OR_DEPARTMENT_OTHER)
Admission: RE | Admit: 2023-07-20 | Discharge: 2023-07-20 | Disposition: A | Discharge status: Home / Self Care | Payer: Managed Care, Other (non HMO) | Source: Ambulatory Visit | Attending: Family Medicine | Admitting: Family Medicine

## 2023-07-20 DIAGNOSIS — N281 Cyst of kidney, acquired: Secondary | ICD-10-CM | POA: Diagnosis present

## 2024-07-05 ENCOUNTER — Other Ambulatory Visit: Payer: Self-pay | Admitting: Family Medicine

## 2024-07-05 DIAGNOSIS — Z1231 Encounter for screening mammogram for malignant neoplasm of breast: Secondary | ICD-10-CM

## 2024-08-09 ENCOUNTER — Ambulatory Visit
Admission: RE | Admit: 2024-08-09 | Discharge: 2024-08-09 | Disposition: A | Discharge status: Home / Self Care | Source: Ambulatory Visit | Attending: Family Medicine | Admitting: Family Medicine

## 2024-08-09 DIAGNOSIS — Z1231 Encounter for screening mammogram for malignant neoplasm of breast: Secondary | ICD-10-CM
